# Patient Record
Sex: Female | Born: 1975 | Race: White | Hispanic: Yes | Marital: Single | Smoking: Never smoker
Health system: Southern US, Community
[De-identification: ages and names within clinical notes are randomized; demographics above are authoritative.]

## PROBLEM LIST (undated history)

## (undated) DIAGNOSIS — Q5210 Doubling of vagina, unspecified: Secondary | ICD-10-CM

## (undated) DIAGNOSIS — F329 Major depressive disorder, single episode, unspecified: Secondary | ICD-10-CM

## (undated) DIAGNOSIS — F32A Depression, unspecified: Secondary | ICD-10-CM

## (undated) DIAGNOSIS — F419 Anxiety disorder, unspecified: Secondary | ICD-10-CM

## (undated) HISTORY — PX: APPENDECTOMY: SHX54

## (undated) HISTORY — PX: ECTOPIC PREGNANCY SURGERY: SHX613

## (undated) HISTORY — DX: Major depressive disorder, single episode, unspecified: F32.9

## (undated) HISTORY — DX: Doubling of vagina, unspecified: Q52.10

## (undated) HISTORY — DX: Depression, unspecified: F32.A

---

## 1998-04-10 ENCOUNTER — Other Ambulatory Visit: Admission: RE | Admit: 1998-04-10 | Discharge: 1998-04-10 | Payer: Self-pay | Admitting: Gynecology

## 1998-07-06 ENCOUNTER — Emergency Department (HOSPITAL_COMMUNITY): Admission: EM | Admit: 1998-07-06 | Discharge: 1998-07-06 | Payer: Self-pay | Admitting: Emergency Medicine

## 1998-07-18 ENCOUNTER — Ambulatory Visit (HOSPITAL_COMMUNITY): Admission: RE | Admit: 1998-07-18 | Discharge: 1998-07-18 | Payer: Self-pay | Admitting: Orthopedic Surgery

## 1998-07-25 ENCOUNTER — Encounter: Admission: RE | Admit: 1998-07-25 | Discharge: 1998-10-23 | Payer: Self-pay | Admitting: Gynecology

## 1998-09-01 ENCOUNTER — Inpatient Hospital Stay (HOSPITAL_COMMUNITY): Admission: AD | Admit: 1998-09-01 | Discharge: 1998-09-01 | Payer: Self-pay | Admitting: Obstetrics and Gynecology

## 1998-09-20 ENCOUNTER — Inpatient Hospital Stay (HOSPITAL_COMMUNITY): Admission: AD | Admit: 1998-09-20 | Discharge: 1998-09-26 | Payer: Self-pay | Admitting: Obstetrics and Gynecology

## 1998-10-26 ENCOUNTER — Other Ambulatory Visit: Admission: RE | Admit: 1998-10-26 | Discharge: 1998-10-26 | Payer: Self-pay | Admitting: Gynecology

## 1999-05-30 ENCOUNTER — Encounter: Payer: Self-pay | Admitting: Emergency Medicine

## 1999-05-31 ENCOUNTER — Inpatient Hospital Stay (HOSPITAL_COMMUNITY): Admission: EM | Admit: 1999-05-31 | Discharge: 1999-06-02 | Payer: Self-pay | Admitting: Emergency Medicine

## 1999-05-31 ENCOUNTER — Encounter: Payer: Self-pay | Admitting: Emergency Medicine

## 2000-05-07 ENCOUNTER — Emergency Department (HOSPITAL_COMMUNITY): Admission: EM | Admit: 2000-05-07 | Discharge: 2000-05-07 | Payer: Self-pay | Admitting: Emergency Medicine

## 2000-05-07 ENCOUNTER — Encounter: Payer: Self-pay | Admitting: Emergency Medicine

## 2001-04-08 ENCOUNTER — Ambulatory Visit (HOSPITAL_COMMUNITY): Admission: RE | Admit: 2001-04-08 | Discharge: 2001-04-08 | Payer: Self-pay | Admitting: *Deleted

## 2001-05-31 ENCOUNTER — Encounter (HOSPITAL_COMMUNITY): Admission: RE | Admit: 2001-05-31 | Discharge: 2001-06-04 | Payer: Self-pay | Admitting: Obstetrics & Gynecology

## 2001-06-03 ENCOUNTER — Inpatient Hospital Stay (HOSPITAL_COMMUNITY): Admission: AD | Admit: 2001-06-03 | Discharge: 2001-06-06 | Payer: Self-pay | Admitting: Obstetrics & Gynecology

## 2001-06-08 ENCOUNTER — Inpatient Hospital Stay (HOSPITAL_COMMUNITY): Admission: AD | Admit: 2001-06-08 | Discharge: 2001-06-08 | Payer: Self-pay | Admitting: *Deleted

## 2007-02-24 ENCOUNTER — Ambulatory Visit: Payer: Self-pay | Admitting: Gynecology

## 2007-02-25 ENCOUNTER — Ambulatory Visit (HOSPITAL_COMMUNITY): Admission: RE | Admit: 2007-02-25 | Discharge: 2007-02-25 | Payer: Self-pay | Admitting: Family Medicine

## 2007-03-10 ENCOUNTER — Ambulatory Visit: Payer: Self-pay | Admitting: *Deleted

## 2009-02-02 ENCOUNTER — Ambulatory Visit: Payer: Self-pay | Admitting: Internal Medicine

## 2009-11-17 HISTORY — PX: TUBAL LIGATION: SHX77

## 2010-08-03 ENCOUNTER — Ambulatory Visit (HOSPITAL_COMMUNITY)
Admission: AD | Admit: 2010-08-03 | Discharge: 2010-08-03 | Payer: Self-pay | Source: Home / Self Care | Admitting: Obstetrics & Gynecology

## 2011-01-30 LAB — CBC
HCT: 37.4 % (ref 36.0–46.0)
MCH: 30.7 pg (ref 26.0–34.0)
MCHC: 34.5 g/dL (ref 30.0–36.0)
RDW: 13.9 % (ref 11.5–15.5)

## 2011-01-30 LAB — GC/CHLAMYDIA PROBE AMP, GENITAL

## 2011-01-30 LAB — ABO/RH: ABO/RH(D): A POS

## 2011-01-30 LAB — WET PREP, GENITAL

## 2011-04-04 NOTE — Op Note (Signed)
[facility]  Patient:    Stacy Barr, Stacy Barr                         MRN: [id number] Proc. Date: 06/03/01 Adm. Date:  [id number] Disc. Date: [id number] Attending:  Antionette Char                           Operative Report  PREOPERATIVE DIAGNOSES:       Intrauterine pregnancy at 39+ weeks.  Breech presentation.  History of intrauterine septum.  History of previous cesarean delivery.  POSTOPERATIVE DIAGNOSES:      Intrauterine pregnancy at 39+ weeks.  Breech presentation.  History of intrauterine septum.  History of previous cesarean delivery.  PROCEDURE:                    Repeat low uterine ______ cesarean delivery via Pfannenstiel.  SURGEONS:                     Roseanna Rainbow, M.D.                               Charles A. Clearance Coots, M.D.  ANESTHESIA:                   Spinal.  COMPLICATIONS:                None.  ESTIMATED BLOOD LOSS:         800 cc.  FLUIDS:                       As per Anesthesiologist.  URINE OUTPUT:                 As per anesthesiologist.  INDICATIONS:                  As above.  INTRAOPERATIVE FINDINGS:       There was an intrauterine septum/bridge bisecting the uterine cavity, extending from the fundus down to cervix.  The female infant was in frank breech presentation.  Normal tubes and ovaries.  DESCRIPTION OF PROCEDURE:     The patient was taken to the operating room with an IV running.  She was placed in the dorsal supine position with a lateral tilt, and then prepped and draped in the usual sterile fashion.   A Pfannenstiel skin incision was then made with the scalpel through the previous scar.  This was extended down to the fascia with the Bovie.  The fascia was incised in the midline and this incision extended bilaterally with Coban scissors.  The superior aspect of the fascial incision was then grasped with Kocher clamps, elevated and the underlying rectus muscle dissected off. The inferior aspect of  the fascial incision was then manipulated in a similar fashion.  The rectus muscles were separated in the midline.  Parietoperitoneum was pinched up and entered sharply with Metzenbaum scissors.  This incision was then extended superiorly and inferiorly, with good visualization of the bladder.  The bladder blade was then inserted and the vesicouterine peritoneum incised.  This incision was then extended bilaterally and the bladder flap created sharply.  The bladder blade was then replaced and the lower uterine segment was then incised in transverse fashion with the scalpel.  This incision was then extended bilaterally with the bandage scissors.  A complete breech extraction was then performed, and the infants head was delivered atraumatically and suctioned with the bulb suction.  The cord was clamped and cut.  The infant was taken to the awaiting neonatologist.  Cord The placenta was then delivered.  The uterus was then evacuated of amniotic fluid, clots and debris with a moistened laparotomy sponge.  The uterine incision was then repaired in a running interlocking fashion with 0 Monocryl.  Excellent hemostasis was noted.  The uterus was returned to the abdomen, and the pericolic gutters were copiously irrigated with warm normal saline.  The fascia was reapproximated with 0 PDS.  The skin was closed with staples.  At the close of the procedure the instruments and pack counts were said to be correct x 2.  Cefazolin 1 g was given at cord clamp.  The patient was taken to the PACU awake and in stable condition. DD:  06/03/01 TD:  06/04/01 Job: [id number]

## 2011-04-04 NOTE — Group Therapy Note (Signed)
NAME:  Stacy Barr, BOVEY NO.:  [id number]   MEDICAL RECORD NO.:  [id number]          PATIENT TYPE:  WOC   LOCATION:  [facility]                   FACILITY:  [facility]   PHYSICIAN:  Ginger Carne, MD DATE OF BIRTH:  1976/07/05   DATE OF SERVICE:                                  CLINIC NOTE   This patient is a 35 year old Hispanic female referred from the [facility] because of concern regarding an intrauterine septum and/or  vaginal septum.  Apparently the notes indicate that an OB ultrasound  demonstrated a septum of the uterus.  There is no evidence of  ultrasonography performed at [facility], there are  no records indicating the same from those records obtained from the  [facility] [place] [facility] health department.  She is a gravida 3, para 2-0-1-2.   SALIENT PHYSICAL FINDINGS:  EXTERNAL GENITALIA:  Vulva and vagina are  normal.  No evidence of septum noted.  The uterus is small, anteverted  and flexed.  Both adnexa palpable, found to be normal.   IMPRESSION AND PLAN:  The patient will have a pelvic sonogram to see if  a septum is present.  The patient understands that if one is there it  would be preferable not to place an intrauterine device to avoid the  risk of expulsion.  The patient will return in 2 weeks for ultrasound  report.           ______________________________  Ginger Carne, MD     SHB/MEDQ  D:  02/24/2007  T:  02/24/2007  Job:  [id number]

## 2011-04-04 NOTE — Discharge Summary (Signed)
[facility]  Patient:    Stacy Barr, Stacy Barr Visit Number: [id number] MRN: [id number]          Service Type: MED Location: [facility] Attending Physician:  Michaelle Copas Dictated by:   Salvadore Dom Admit Date:  06/08/2001 Discharge Date: 06/08/2001   CC:         Roseanna Rainbow, M.D.   Discharge Summary  REASON FOR ADMISSION:         Elective repeat cesarean section.  ATTENDING PHYSICIAN:          Roseanna Rainbow, M.D.  HISTORY OF PRESENT ILLNESS:   This is a 35 year old G3, P1-0-1-1, who presented at 39+ weeks for elective repeat cesarean section secondary to breech presentation and a history of a previous cesarean section for the same due to intrauterine septum.  PAST OBSTETRICAL HISTORY:     One prior cesarean section for delivery of a viable female infant at 8 pounds 5 ounces.  GYNECOLOGICAL HISTORY:        One prior spontaneous abortion at approximately 12 weeks with a D&C.  MEDICATIONS:                  Prenatal vitamins.  ALLERGIES:                    No known drug allergies.  HOSPITAL COURSE:              The patient was delivered by cesarean section of a female infant in frank breech presentation. She tolerated the procedure well. Her postpartum course was uneventful with routine postoperative care. The patient breast fed and requested IUD for contraception. At discharge the patient was in good condition.  DISPOSITION:                  She was discharged home on p.o. Motrin for pain. She was given routine postoperative and postpartum instructions per booklet. Education was done with the translator. She was placed on ad lib activity at home with pelvic rest.  DISCHARGE FOLLOWUP:           The patient is to follow up at [facility] in six weeks for routine postpartum care. Dictated by:   Salvadore Dom Attending Physician:  Michaelle Copas DD:  07/22/01 TD:  07/22/01 Job: [id number]

## 2011-04-04 NOTE — Consult Note (Signed)
[facility]  Patient:    Stacy Barr, Stacy Barr                         MRN: [id number] Adm. Date:  [id number] Attending:  Antionette Char CC:         [facility]  OB Teaching Service Office at [facility]   Consultation Report  OBSTETRICAL CONSULTATION  REASON FOR CONSULTATION:       The patient is a 35 year old, para 1, now at 38+ weeks with late onset of prenatal care and dating by a 31-week ultrasound, who presents for consultation regarding delivery plan.  PAST OBSTETRICAL HISTORY:     Remarkable for a cesarean delivery in 1999 for arrest of descent. That pregnancy was complicated by gestational diabetes, diet controlled, and she was delivered of an 8-pound 5-ounce infant. During the pregnancy on exam, it was felt that she might have had a didelphic uterus; however, at the time of delivery, the patient progressed to fully dilated, with an epidural. She pushed for an hour and a half without descent of the head. The position of the head was not documented. However, on an exam, she was felt to have a vaginal septum. At C-section, the operative findings were described as a uterine septum and not a didelphic uterus with the septum extending through the cervix into the vagina.  The present pregnancy to date has been uncomplicated. She does not have any glucose intolerance, and, by the patients report, she feels that this fetus is smaller. On physical exam, on speculum exam there may be some redundant tissue in the upper vagina around the cervix; however, on digital exam, no distinct septum was palpated.  The patient would like a trial of labor, and she did have a low transverse cesarean delivery in 1999. The patient was counseled regarding the increased risk of possible repeat cesarean delivery or possible resection of the septum in labor if it is very thin, causing dystocia.  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  PLAN:                         If the patient comes in spontaneously laboring, would not augment for any arrest disorders, and would consider an epidural and a more thorough exam, and possible intrapartum resection of the septum. If the patient would need to be induced for any indication, would consider a possible Foley bulb, with Pitocin only to initiate the labor process. DD:  05/27/01 TD:  05/27/01 Job: [id number]

## 2012-11-15 ENCOUNTER — Ambulatory Visit (INDEPENDENT_AMBULATORY_CARE_PROVIDER_SITE_OTHER): Payer: Self-pay | Admitting: Family Medicine

## 2012-11-15 VITALS — BP 110/60 | HR 69 | Temp 98.3°F | Ht 59.0 in | Wt 129.0 lb

## 2012-11-15 DIAGNOSIS — F32A Depression, unspecified: Secondary | ICD-10-CM

## 2012-11-15 DIAGNOSIS — F329 Major depressive disorder, single episode, unspecified: Secondary | ICD-10-CM

## 2012-11-15 DIAGNOSIS — N946 Dysmenorrhea, unspecified: Secondary | ICD-10-CM

## 2012-11-15 DIAGNOSIS — B354 Tinea corporis: Secondary | ICD-10-CM

## 2012-11-15 MED ORDER — FLUOXETINE HCL 20 MG PO TABS: 20.0000 mg | ORAL_TABLET | Freq: Every day | ORAL | Status: DC

## 2012-11-15 NOTE — Patient Instructions (Addendum)
For the depression, I am starting you on a medication called prozac to take every day. I would like to see you back in 3 weeks.   For the rash, you can get an over the counter cream that is called clotrimazole.   For the pain with the periods, you can take ibuprofen 2 tablets every 6 hours one day before your period.

## 2012-11-15 NOTE — Progress Notes (Signed)
Patient ID: Stacy Barr, female   DOB: Oct 05, 1976, 36 y.o.   MRN: [id number]  No chief complaint on file.   HPI Stacy Barr is a 36 y.o. female who presents as a new patient. [facility].  History was obtained through a live Spanish interpreter HPI Concerns include:  - depression: x 10 years. Occasions where she starts crying for no reason, occurs every 3 weeks, associated with bone pain every 4 months. During these episodes, she feels withdrawn, sleeps during the day, gets irritable. She has trouble falling asleep at night and feels tired during the day. [facility] after a trial of improved diet and exercise, but it closed before she could be started on it. She has never been on medications for depression before. She has never had counseling for it either. She denies any suicidal or homicidal ideation. She denies any auditory or visual hallucinations.  MDQ scores 2 points, positive for self confidence and more talkative. Otherwise, denies excessive spending, sexual behavior, denies periods of not sleeping at night time. No substance abuse.  - dysmenorrhea: cramps in first 2 days of menstrual cycle. Takes over the counter medicine for painful periods which has helped. LMP Dec 7th. Periods are every month but not regular as in they occur with 1 week variation of the month. Heavy in first 2 days, using 4-5 pads per day.  - rash in the inguinal area and along the pubic hair line. Itches. Present for 1 month. Burning associated as well. Not present intravaginal or around labia. No fever. She reports she shaves that area and used a razor that was a few days old.   Past Medical History  Diagnosis Date  . Depression     x 10 years, never on medication  . Septate vagina     Past Surgical History  Procedure Date  . Ectopic pregnancy surgery   . Tubal ligation 2011    left  salpingectomy and right ligation s/p left ectopic    Family History  Problem Relation Age of Onset  . Diabetes Mother   . Hypertension Mother   . Depression Sister     Social History History  Substance Use Topics  . Smoking status: Never Smoker   . Smokeless tobacco: Not on file  . Alcohol Use: No    Allergies not on file  Current Outpatient Prescriptions  Medication Sig Dispense Refill  . FLUoxetine (PROZAC) 20 MG tablet Take 1 tablet (20 mg total) by mouth daily.  30 tablet  3    Review of Systems Review of Systems Negative except per HPI Blood pressure 110/60, pulse 69, temperature 98.3 F (36.8 C), temperature source Oral, height 4\' 11"  (1.499 m), weight 129 lb (58.514 kg).  Physical Exam Physical Exam Physical Examination: General appearance - alert, well appearing, and in no distress Nose - normal and patent, no erythema, discharge or polyps Mouth - mucous membranes moist, pharynx normal without lesions Neck - supple, no significant adenopathy Chest - clear to auscultation, no wheezes, rales or rhonchi, symmetric air entry Heart - normal rate, regular rhythm, normal S1, S2, no murmurs, rubs, clicks or gallops Abdomen - soft, nontender, nondistended, no masses or organomegaly Extremities - peripheral pulses normal, no pedal edema, no clubbing or cyanosis Skin - irregular, ovoid erythematous papules with light scaly border, present on left and right inguinal area along hair line as well  as on mons pubis.   Data Reviewed No labs available  Assessment    See problem list    Plan    See problem list       Jillyan Plitt 11/17/2012, 10:15 AM

## 2012-11-16 DIAGNOSIS — F329 Major depressive disorder, single episode, unspecified: Secondary | ICD-10-CM | POA: Insufficient documentation

## 2012-11-16 DIAGNOSIS — B354 Tinea corporis: Secondary | ICD-10-CM | POA: Insufficient documentation

## 2012-11-16 DIAGNOSIS — F32A Depression, unspecified: Secondary | ICD-10-CM | POA: Insufficient documentation

## 2012-11-16 NOTE — Assessment & Plan Note (Signed)
? ?  Over the counter clotrimazole ?

## 2012-11-16 NOTE — Assessment & Plan Note (Signed)
Long standing depression, never previously treated with medication. Patient interested in treatment. MDQ screen score of 2 making a component of bipolar depression unlikely. Will treat unipolar depression with prozac 20mg  daily and follow up in 3 weeks.

## 2012-11-17 ENCOUNTER — Encounter: Payer: Self-pay | Admitting: Family Medicine

## 2012-11-17 DIAGNOSIS — N946 Dysmenorrhea, unspecified: Secondary | ICD-10-CM | POA: Insufficient documentation

## 2012-11-17 NOTE — Assessment & Plan Note (Addendum)
Ibuprofen 400mg  every 6hours prior to period onset and in fort 2 days of menstrual cycle. Patient to return for PAP smear and well woman exam at next visit in 3 weeks.

## 2012-12-08 ENCOUNTER — Ambulatory Visit: Payer: Self-pay | Admitting: Family Medicine

## 2012-12-20 ENCOUNTER — Ambulatory Visit (INDEPENDENT_AMBULATORY_CARE_PROVIDER_SITE_OTHER): Payer: Self-pay | Admitting: Family Medicine

## 2012-12-20 ENCOUNTER — Encounter: Payer: Self-pay | Admitting: Family Medicine

## 2012-12-20 VITALS — BP 105/73 | HR 110 | Temp 102.0°F | Ht 59.0 in | Wt 125.3 lb

## 2012-12-20 DIAGNOSIS — B179 Acute viral hepatitis, unspecified: Secondary | ICD-10-CM | POA: Insufficient documentation

## 2012-12-20 DIAGNOSIS — R17 Unspecified jaundice: Secondary | ICD-10-CM

## 2012-12-20 DIAGNOSIS — K7200 Acute and subacute hepatic failure without coma: Secondary | ICD-10-CM

## 2012-12-20 LAB — COMPREHENSIVE METABOLIC PANEL
ALT: 162 U/L — ABNORMAL HIGH (ref 0–35)
AST: 138 U/L — ABNORMAL HIGH (ref 0–37)
Alkaline Phosphatase: 179 U/L — ABNORMAL HIGH (ref 39–117)
BUN: 14 mg/dL (ref 6–23)
Chloride: 103 mEq/L (ref 96–112)
Creat: 0.88 mg/dL (ref 0.50–1.10)
Total Bilirubin: 5.6 mg/dL — ABNORMAL HIGH (ref 0.3–1.2)

## 2012-12-20 LAB — CBC
MCH: 29.2 pg (ref 26.0–34.0)
MCHC: 35.2 g/dL (ref 30.0–36.0)
MCV: 82.8 fL (ref 78.0–100.0)
Platelets: 161 10*3/uL (ref 150–400)
RDW: 14 % (ref 11.5–15.5)
WBC: 1.8 10*3/uL — ABNORMAL LOW (ref 4.0–10.5)

## 2012-12-20 NOTE — Progress Notes (Signed)
  Subjective:    Patient ID: Stacy Barr, female    DOB: Jul 01, 1976, 37 y.o.   MRN: [id number]  HPI 3--4 days of sudden onset of headache, myalgias, nausea, dizziness, confused. Worst the first 2 days, but still intermittently bad. Taking aspirin, which helps, but makes her stomach burn. Last vomited 2 days ago. Urine was dark this morning, but her stools haven't become lighter. No sore throat or cough. No constipation or diarrhea.  No other ill contacts. Lives only with her partner of 3 years and 2 daughters, ages 22 and 69.  No family history of hepatitis. Denies iv drug history or ever having hepatitis herself. Ate at [facility] recently and at a [employer] a couple weeks ago.   Her period started 4 days ago (which is usually very uncomfortable with cramps) with abdominal pain which has now resolved.   Never got her antidepressant medicine or followed up due to lack of money.  Review of Systems     Objective:   Physical Exam  Constitutional: She appears well-developed and well-nourished.  HENT:  Mouth/Throat: No oropharyngeal exudate.  Cardiovascular: Normal rate and regular rhythm.   Pulmonary/Chest: Effort normal and breath sounds normal.  Abdominal: Soft. She exhibits no distension. There is no rebound.       Mild LEFT UPPER QUADRANT tenderness. No hepatomegaly.   Lymphadenopathy:    She has no cervical adenopathy.  Neurological: She is alert.  Skin:       Icteric including her right conjunctiva.           Assessment & Plan:

## 2012-12-20 NOTE — Assessment & Plan Note (Signed)
The most severe systemic symptoms appear to be improved and she doesn't look dehydrated.   No RUQ pain or prior episodes to suggest CBD obstruction.

## 2012-12-20 NOTE — Progress Notes (Signed)
Interpreter Wyvonnia Dusky for Dr Tye Savoy in [facility]

## 2012-12-20 NOTE — Patient Instructions (Signed)
We will call her to discuss her lab results.   She can use Ibuprofen in modest doses for her myagias.   To call if recurrent vomiting.

## 2012-12-21 LAB — HEPATITIS PANEL, ACUTE
Hep B C IgM: NEGATIVE
Hepatitis B Surface Ag: NEGATIVE

## 2012-12-22 ENCOUNTER — Telehealth: Payer: Self-pay | Admitting: Family Medicine

## 2012-12-22 NOTE — Telephone Encounter (Signed)
Pt will like to know labs results, pt will like doctor call her.  [place]

## 2012-12-22 NOTE — Telephone Encounter (Signed)
Forward to PCP to review abnormal labs

## 2012-12-23 ENCOUNTER — Ambulatory Visit (INDEPENDENT_AMBULATORY_CARE_PROVIDER_SITE_OTHER): Payer: Self-pay | Admitting: Family Medicine

## 2012-12-23 ENCOUNTER — Encounter: Payer: Self-pay | Admitting: Family Medicine

## 2012-12-23 ENCOUNTER — Telehealth: Payer: Self-pay | Admitting: Family Medicine

## 2012-12-23 VITALS — BP 120/69 | HR 100 | Temp 98.7°F | Ht 59.0 in | Wt 125.0 lb

## 2012-12-23 DIAGNOSIS — K7200 Acute and subacute hepatic failure without coma: Secondary | ICD-10-CM

## 2012-12-23 DIAGNOSIS — F329 Major depressive disorder, single episode, unspecified: Secondary | ICD-10-CM

## 2012-12-23 DIAGNOSIS — B179 Acute viral hepatitis, unspecified: Secondary | ICD-10-CM

## 2012-12-23 LAB — COMPREHENSIVE METABOLIC PANEL
AST: 237 U/L — ABNORMAL HIGH (ref 0–37)
Albumin: 4.2 g/dL (ref 3.5–5.2)
Alkaline Phosphatase: 180 U/L — ABNORMAL HIGH (ref 39–117)
BUN: 12 mg/dL (ref 6–23)
Calcium: 9.7 mg/dL (ref 8.4–10.5)
Chloride: 100 mEq/L (ref 96–112)
Creat: 0.77 mg/dL (ref 0.50–1.10)
Glucose, Bld: 118 mg/dL — ABNORMAL HIGH (ref 70–99)
Potassium: 3.5 mEq/L (ref 3.5–5.3)

## 2012-12-23 LAB — CBC WITH DIFFERENTIAL/PLATELET
Basophils Absolute: 0 10*3/uL (ref 0.0–0.1)
Basophils Relative: 1 % (ref 0–1)
Eosinophils Relative: 1 % (ref 0–5)
HCT: 37.8 % (ref 36.0–46.0)
Hemoglobin: 13.2 g/dL (ref 12.0–15.0)
MCHC: 34.9 g/dL (ref 30.0–36.0)
MCV: 83.4 fL (ref 78.0–100.0)
Monocytes Absolute: 0.5 10*3/uL (ref 0.1–1.0)
Monocytes Relative: 14 % — ABNORMAL HIGH (ref 3–12)
RDW: 14.1 % (ref 11.5–15.5)

## 2012-12-23 LAB — BILIRUBIN,DIRECT & INDIRECT (FRACTIONATED): Indirect Bilirubin: 2.7 mg/dL — ABNORMAL HIGH (ref 0.0–0.9)

## 2012-12-23 NOTE — Telephone Encounter (Signed)
Tried calling patient with the pacific phone interpreter but phone did not answer and there was not a possibility to leave a message.   Will send a letter to the patient letting her know that the hepatitis virus testing was normal. Her liver tests were a little elevated, but if she is feeling better and not having persistent symptoms, she can follow up in clinic in 1 week and repeat lab work.

## 2012-12-23 NOTE — Patient Instructions (Addendum)
Voy a llamarle con los [place]. I will call with your labs.  Llamanos si tiene calentura mas alta que 100.6. Call us if your fever starts going above 100.6

## 2012-12-23 NOTE — Telephone Encounter (Signed)
I called Stacy Barr to review her lab studies which were negative for hepatitis and show elevated bilirubin, alk phos and transaminases. WBC was 1.9 K. She reports fever, intermittent RUQ pain, nausea, poor appetite, dark urine and stool with only one BM in past several days.   I asked her to call me back if she can get a ride to our office, otherwise she will need to go to the ER.

## 2012-12-24 ENCOUNTER — Telehealth: Payer: Self-pay | Admitting: Family Medicine

## 2012-12-24 NOTE — Telephone Encounter (Signed)
I called to give her the lab results. She says she is feeling better with less pains, but she remains nauseated. I told her that the labs don't suggest gall stone obstruction or infection. I asked her to come back for follow up in [facility], but sooner if she worsens or develops new symptoms. She is agreeable to that plan.

## 2012-12-24 NOTE — Assessment & Plan Note (Signed)
Not having severe symptoms now so will continue holding Fluoxetine.

## 2012-12-24 NOTE — Progress Notes (Signed)
  Subjective:    Patient ID: Minerva Areola, female    DOB: 11-Apr-1976, 37 y.o.   MRN: [id number]  HPI She has less myalgias, but still has subjective fevers. She intermittently has abdominal pain, but on the left side rather than the right side (she had said the reverse on the phone. Nauseated and poor appetite. Drinking cold liquids causes headaches. Chilly, but no shaking chills. 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    Review of Systems  Constitutional: Positive for fever, appetite change and fatigue. Negative for chills and unexpected weight change.  Respiratory: Negative for cough and shortness of breath.   Cardiovascular: Negative for chest pain.  Gastrointestinal: Positive for nausea and constipation. Negative for vomiting and diarrhea.  Genitourinary: Negative for dysuria and difficulty urinating.  Neurological: Positive for light-headedness.       Objective:   Physical Exam  Constitutional: She appears well-developed and well-nourished.       Mildly ill appearing  Cardiovascular: Normal rate and regular rhythm.   No murmur heard. Pulmonary/Chest: Effort normal and breath sounds normal.  Abdominal: Soft. She exhibits no distension and no mass. There is no tenderness. There is no guarding.       No hepatomegaly or RUQ tenderness, even on deep inspiration.   Neurological: She is alert.  Skin:       Icteric, particularly face, conjunctivae, and trunk          Assessment & Plan:

## 2012-12-24 NOTE — Assessment & Plan Note (Signed)
No current signs of extrahepatic obstruction or hemolysis. Will check CBC due to the low WBC and CMET to trend transaminases, and will fractionate bilirubin  She hasn't  lost weight  Consider Leptospirosis or other uncommon illness if other symptoms develop.

## 2012-12-31 ENCOUNTER — Ambulatory Visit: Payer: Self-pay | Admitting: Family Medicine

## 2013-01-03 ENCOUNTER — Ambulatory Visit (INDEPENDENT_AMBULATORY_CARE_PROVIDER_SITE_OTHER): Payer: Self-pay | Admitting: Family Medicine

## 2013-01-03 ENCOUNTER — Encounter: Payer: Self-pay | Admitting: Family Medicine

## 2013-01-03 VITALS — BP 108/72 | HR 89 | Temp 98.2°F | Ht 59.0 in | Wt 129.8 lb

## 2013-01-03 DIAGNOSIS — B179 Acute viral hepatitis, unspecified: Secondary | ICD-10-CM

## 2013-01-03 DIAGNOSIS — K7200 Acute and subacute hepatic failure without coma: Secondary | ICD-10-CM

## 2013-01-03 DIAGNOSIS — F329 Major depressive disorder, single episode, unspecified: Secondary | ICD-10-CM

## 2013-01-03 DIAGNOSIS — N946 Dysmenorrhea, unspecified: Secondary | ICD-10-CM

## 2013-01-03 MED ORDER — IBUPROFEN 200 MG PO TABS: 600.0000 mg | ORAL_TABLET | Freq: Four times a day (QID) | ORAL | Status: DC | PRN

## 2013-01-03 NOTE — Assessment & Plan Note (Signed)
Educated her to use Ibuprofen early with menstrual symptoms

## 2013-01-03 NOTE — Assessment & Plan Note (Signed)
Improving and appears less jaundiced. Will do labs later, hopefully she will have coverage by then.

## 2013-01-03 NOTE — Progress Notes (Signed)
Interpreter [place] Namihira for [facility] 

## 2013-01-03 NOTE — Progress Notes (Signed)
Subjective:     Patient ID: Stacy Barr, female   DOB: 02-12-1976, 37 y.o.   MRN: [id number]  HPI Jaundice - she feels back to normal with lighter urine and return of her appetite. The headache and myalgias are gone. No one else that she knows has gotten sick. She has been able to return to work, but only a few hours a couple days a week are available.   Depression - She denies feeling depressed and never got the Fluoxetine filled.   Menstrual cramps - These are always severe with associated headache, nausea, bloating. She take Acetaminophen or and OTC preparation. Last pap smear was a year ago.   Review of Systems     Objective:   Physical Exam  Abdominal: Soft. She exhibits distension. She exhibits no mass. There is no tenderness. There is no rebound.  Neurological: She is alert.  Skin: Skin is warm and dry.  Less icteric, but still visible, particularly on the unexposed bulbar conjunctiva.   Psychiatric: She has a normal mood and affect. Her behavior is normal. Judgment and thought content normal.       Assessment:         Plan:

## 2013-01-03 NOTE — Assessment & Plan Note (Signed)
She denies current problems

## 2013-01-03 NOTE — Patient Instructions (Signed)
It seems that you had some form of mild hepatitis that shouldn't harm your liver permanently, but when you have medical insurance or the orange card we should retest your liver tests.   Parece que ha tenido [place] clase de hepatitis leve que no debe causar [employer] al higado. Cuando tiene seguro medico o la targeta naranjada debemos repetir las pruebas del higado.    Dismenorrea (Dysmenorrhea) La dismenorrea es la sensacin de dolor durante el perodo menstrual. El dolor se debe a la tensin (contraccin) de los msculos del tero. Este trastorno puede producir dolor de cabeza, Dentist estomacal (nuseas), vmitos, o dolor en la zona lumbar. CUIDADOS EN EL HOGAR  Slo tome medicamentos como lo indique su mdico. Ibuprofena 200 mg 6 pastillas tres veces al dia con comida.  Coloque una almohadilla trmica o una botella de agua caliente en la zona lumbar o en el vientre (abdomen). No debe dormir con la almohadilla trmica.  La actividad fsica puede ayudar a Teacher, early years/pre.  [place]ee la zona lumbar o el vientre.  Deje de fumar.  Evite el alcohol y la cafena.  Pruebe con el yoga o la acupuntura. SOLICITE AYUDA DE INMEDIATO SI:   El dolor no se alivia con el medicamento.  El dolor [place], incluso con los medicamentos.  Tiene fiebre.  Siente nuseas de forma constante o no puede parar de vomitar.  El dolor sube a la parte superior del vientre.  El sangrado del perodo es ms abundante que lo normal.  Se desmaya. ASEGRESE DE QUE:  Comprende estas instrucciones.  Controlar su enfermedad.  Solicitar ayuda de inmediato si no mejora o si empeora. Document Released: 12/06/2010 Document Revised: 01/26/2012 [facility] Patient Information 2013 [place], [place].

## 2013-07-12 ENCOUNTER — Ambulatory Visit: Payer: Self-pay

## 2014-05-30 ENCOUNTER — Ambulatory Visit: Payer: Self-pay

## 2014-06-05 ENCOUNTER — Ambulatory Visit: Payer: Self-pay

## 2017-11-13 ENCOUNTER — Encounter (HOSPITAL_COMMUNITY): Payer: Self-pay | Admitting: Emergency Medicine

## 2017-11-13 ENCOUNTER — Ambulatory Visit (HOSPITAL_COMMUNITY)
Admission: EM | Admit: 2017-11-13 | Discharge: 2017-11-13 | Disposition: A | Discharge status: Home / Self Care | Payer: Self-pay

## 2017-11-13 ENCOUNTER — Other Ambulatory Visit: Payer: Self-pay

## 2017-11-13 DIAGNOSIS — R300 Dysuria: Secondary | ICD-10-CM

## 2017-11-13 DIAGNOSIS — N939 Abnormal uterine and vaginal bleeding, unspecified: Secondary | ICD-10-CM | POA: Diagnosis present

## 2017-11-13 DIAGNOSIS — N12 Tubulo-interstitial nephritis, not specified as acute or chronic: Secondary | ICD-10-CM

## 2017-11-13 DIAGNOSIS — N200 Calculus of kidney: Secondary | ICD-10-CM | POA: Diagnosis present

## 2017-11-13 DIAGNOSIS — Z3202 Encounter for pregnancy test, result negative: Secondary | ICD-10-CM

## 2017-11-13 DIAGNOSIS — N7093 Salpingitis and oophoritis, unspecified: Principal | ICD-10-CM | POA: Diagnosis present

## 2017-11-13 LAB — CBC WITH DIFFERENTIAL/PLATELET
BASOS PCT: 0 %
Basophils Absolute: 0 10*3/uL (ref 0.0–0.1)
EOS ABS: 0 10*3/uL (ref 0.0–0.7)
EOS PCT: 0 %
HEMATOCRIT: 34.5 % — AB (ref 36.0–46.0)
Hemoglobin: 11.4 g/dL — ABNORMAL LOW (ref 12.0–15.0)
Lymphocytes Relative: 13 %
Lymphs Abs: 1 10*3/uL (ref 0.7–4.0)
MCH: 28.7 pg (ref 26.0–34.0)
MCHC: 33 g/dL (ref 30.0–36.0)
MCV: 86.9 fL (ref 78.0–100.0)
MONO ABS: 1.1 10*3/uL — AB (ref 0.1–1.0)
MONOS PCT: 14 %
Neutro Abs: 5.6 10*3/uL (ref 1.7–7.7)
Neutrophils Relative %: 73 %
PLATELETS: 229 10*3/uL (ref 150–400)
RBC: 3.97 MIL/uL (ref 3.87–5.11)
RDW: 13.3 % (ref 11.5–15.5)
WBC: 7.7 10*3/uL (ref 4.0–10.5)

## 2017-11-13 LAB — POCT URINALYSIS DIP (DEVICE)
GLUCOSE, UA: NEGATIVE mg/dL
Ketones, ur: >=160 mg/dL — AB
NITRITE: POSITIVE — AB
Protein, ur: 100 mg/dL — AB
Specific Gravity, Urine: >=1.03 (ref 1.005–1.030)
UROBILINOGEN UA: 4 mg/dL — AB (ref 0.0–1.0)
pH: 5.5 (ref 5.0–8.0)

## 2017-11-13 LAB — COMPREHENSIVE METABOLIC PANEL
ALBUMIN: 3.5 g/dL (ref 3.5–5.0)
ALT: 72 U/L — ABNORMAL HIGH (ref 14–54)
ANION GAP: 13 (ref 5–15)
AST: 56 U/L — ABNORMAL HIGH (ref 15–41)
Alkaline Phosphatase: 119 U/L (ref 38–126)
BILIRUBIN TOTAL: 3.4 mg/dL — AB (ref 0.3–1.2)
BUN: 10 mg/dL (ref 6–20)
CO2: 19 mmol/L — ABNORMAL LOW (ref 22–32)
Calcium: 8.6 mg/dL — ABNORMAL LOW (ref 8.9–10.3)
Chloride: 99 mmol/L — ABNORMAL LOW (ref 101–111)
Creatinine, Ser: 0.8 mg/dL (ref 0.44–1.00)
GFR calc Af Amer: >60 mL/min (ref >60)
GFR calc non Af Amer: >60 mL/min (ref >60)
GLUCOSE: 98 mg/dL (ref 65–99)
POTASSIUM: 3.4 mmol/L — AB (ref 3.5–5.1)
SODIUM: 131 mmol/L — AB (ref 135–145)
TOTAL PROTEIN: 7.7 g/dL (ref 6.5–8.1)

## 2017-11-13 LAB — POCT PREGNANCY, URINE: Preg Test, Ur: NEGATIVE

## 2017-11-13 MED ORDER — ACETAMINOPHEN 325 MG PO TABS
ORAL_TABLET | ORAL | Status: AC
  Filled 2017-11-13: qty 2

## 2017-11-13 MED ORDER — ACETAMINOPHEN 325 MG PO TABS
650.0000 mg | ORAL_TABLET | Freq: Once | ORAL | Status: AC
  Administered 2017-11-13: 650 mg via ORAL

## 2017-11-13 NOTE — ED Triage Notes (Signed)
Pt states her aunt gave her "neomicina", neomycin, took it yesterday and today.

## 2017-11-13 NOTE — ED Provider Notes (Signed)
Patient arrived to urgent care with 2-day history of generalized body aches, urinary urgency/frequency, right back and pelvic pain.  Patient with tachycardia, tachypnea, febrile at 101.2.  She appears to be in mild distress, without diaphoresis.  Urine dipstick showed moderate bilirubin, greater than 160 ketones, large blood in urine, positive nitrites, small leukocytes. + CVA tenderness.  Given patient's vital signs with mild distress, will discharge patient to emergency department for further evaluation.   Belinda FisherYu, Latesha Chesney V, PA-C 11/13/17 1956

## 2017-11-13 NOTE — Discharge Instructions (Signed)
Please go to the emergency department for further evaluation and treatment needed.

## 2017-11-13 NOTE — ED Triage Notes (Signed)
Pt c/o pain everywhere, generalized body ache. Pt c/o urgency with urination. C/o pelvic pain. Pt also c/o R hip pain. Pt is febrile and tachycardic.

## 2017-11-13 NOTE — ED Triage Notes (Signed)
Patient seen at urgent care today diagnosed with UTI , urine specimen collected at urgent care /received Tylenol for fever , sent here for further evaluation  , pt. endorses urinary frequency and right low back pain .

## 2017-11-14 ENCOUNTER — Emergency Department (HOSPITAL_COMMUNITY): Payer: Self-pay

## 2017-11-14 ENCOUNTER — Other Ambulatory Visit: Payer: Self-pay

## 2017-11-14 ENCOUNTER — Inpatient Hospital Stay (HOSPITAL_COMMUNITY)
Admission: EM | Admit: 2017-11-14 | Discharge: 2017-11-15 | DRG: 759 | Disposition: A | Discharge status: Home / Self Care | Payer: Self-pay | Attending: Obstetrics and Gynecology | Admitting: Obstetrics and Gynecology

## 2017-11-14 ENCOUNTER — Encounter (HOSPITAL_COMMUNITY): Payer: Self-pay

## 2017-11-14 DIAGNOSIS — N939 Abnormal uterine and vaginal bleeding, unspecified: Secondary | ICD-10-CM | POA: Diagnosis present

## 2017-11-14 DIAGNOSIS — N200 Calculus of kidney: Secondary | ICD-10-CM

## 2017-11-14 DIAGNOSIS — N390 Urinary tract infection, site not specified: Secondary | ICD-10-CM

## 2017-11-14 DIAGNOSIS — N12 Tubulo-interstitial nephritis, not specified as acute or chronic: Secondary | ICD-10-CM

## 2017-11-14 DIAGNOSIS — N7093 Salpingitis and oophoritis, unspecified: Secondary | ICD-10-CM | POA: Diagnosis present

## 2017-11-14 HISTORY — DX: Anxiety disorder, unspecified: F41.9

## 2017-11-14 LAB — URINALYSIS, ROUTINE W REFLEX MICROSCOPIC
Bilirubin Urine: NEGATIVE
Glucose, UA: 50 mg/dL — AB
KETONES UR: 80 mg/dL — AB
NITRITE: NEGATIVE
PROTEIN: 100 mg/dL — AB
Specific Gravity, Urine: 1.024 (ref 1.005–1.030)
pH: 5 (ref 5.0–8.0)

## 2017-11-14 LAB — I-STAT BETA HCG BLOOD, ED (MC, WL, AP ONLY)

## 2017-11-14 LAB — WET PREP, GENITAL
CLUE CELLS WET PREP: NONE SEEN
Sperm: NONE SEEN
TRICH WET PREP: NONE SEEN
Yeast Wet Prep HPF POC: NONE SEEN

## 2017-11-14 LAB — I-STAT CG4 LACTIC ACID, ED: LACTIC ACID, VENOUS: 1.02 mmol/L (ref 0.5–1.9)

## 2017-11-14 MED ORDER — DEXTROSE 5 % IV SOLN
1.0000 g | Freq: Once | INTRAVENOUS | Status: AC
  Administered 2017-11-14: 1 g via INTRAVENOUS
  Filled 2017-11-14: qty 10

## 2017-11-14 MED ORDER — HYDROMORPHONE HCL 1 MG/ML IJ SOLN
0.5000 mg | INTRAMUSCULAR | Status: DC | PRN
  Filled 2017-11-14: qty 1

## 2017-11-14 MED ORDER — DOCUSATE SODIUM 100 MG PO CAPS: 100.0000 mg | ORAL_CAPSULE | Freq: Two times a day (BID) | ORAL | Status: DC | PRN

## 2017-11-14 MED ORDER — DOXYCYCLINE HYCLATE 100 MG IV SOLR
100.0000 mg | Freq: Once | INTRAVENOUS | Status: AC
  Administered 2017-11-14: 100 mg via INTRAVENOUS
  Filled 2017-11-14: qty 100

## 2017-11-14 MED ORDER — SODIUM CHLORIDE 0.9 % IV SOLN
INTRAVENOUS | Status: AC
  Administered 2017-11-14: 18:00:00 via INTRAVENOUS

## 2017-11-14 MED ORDER — METRONIDAZOLE 500 MG PO TABS
500.0000 mg | ORAL_TABLET | Freq: Two times a day (BID) | ORAL | Status: DC
  Administered 2017-11-14: 500 mg via ORAL
  Filled 2017-11-14: qty 1

## 2017-11-14 MED ORDER — ONDANSETRON HCL 4 MG/2ML IJ SOLN: 4.0000 mg | Freq: Four times a day (QID) | INTRAMUSCULAR | Status: DC | PRN

## 2017-11-14 MED ORDER — TAMSULOSIN HCL 0.4 MG PO CAPS
0.4000 mg | ORAL_CAPSULE | Freq: Every day | ORAL | Status: DC
  Administered 2017-11-15: 0.4 mg via ORAL
  Filled 2017-11-14 (×3): qty 1

## 2017-11-14 MED ORDER — SODIUM CHLORIDE 0.9 % IV BOLUS (SEPSIS)
1700.0000 mL | Freq: Once | INTRAVENOUS | Status: AC
  Administered 2017-11-14: 1700 mL via INTRAVENOUS

## 2017-11-14 MED ORDER — ONDANSETRON HCL 4 MG/2ML IJ SOLN
4.0000 mg | Freq: Once | INTRAMUSCULAR | Status: DC
  Filled 2017-11-14: qty 2

## 2017-11-14 MED ORDER — DOXYCYCLINE HYCLATE 100 MG PO TABS
100.0000 mg | ORAL_TABLET | Freq: Two times a day (BID) | ORAL | Status: DC
  Administered 2017-11-15: 100 mg via ORAL
  Filled 2017-11-14 (×2): qty 1

## 2017-11-14 MED ORDER — ONDANSETRON HCL 4 MG/2ML IJ SOLN: 4.0000 mg | Freq: Three times a day (TID) | INTRAMUSCULAR | Status: DC | PRN

## 2017-11-14 MED ORDER — KETOROLAC TROMETHAMINE 30 MG/ML IJ SOLN
30.0000 mg | Freq: Once | INTRAMUSCULAR | Status: AC
  Administered 2017-11-14: 30 mg via INTRAVENOUS
  Filled 2017-11-14: qty 1

## 2017-11-14 MED ORDER — ACETAMINOPHEN 325 MG PO TABS: 650.0000 mg | ORAL_TABLET | ORAL | Status: DC | PRN

## 2017-11-14 MED ORDER — OXYCODONE-ACETAMINOPHEN 5-325 MG PO TABS
1.0000 | ORAL_TABLET | ORAL | Status: AC | PRN
  Administered 2017-11-14 – 2017-11-15 (×2): 1 via ORAL
  Filled 2017-11-14 (×2): qty 1

## 2017-11-14 MED ORDER — SODIUM CHLORIDE 0.9 % IV SOLN
Freq: Once | INTRAVENOUS | Status: AC
  Administered 2017-11-14: 10:00:00 via INTRAVENOUS

## 2017-11-14 MED ORDER — ALUM & MAG HYDROXIDE-SIMETH 200-200-20 MG/5ML PO SUSP: 30.0000 mL | ORAL | Status: DC | PRN

## 2017-11-14 MED ORDER — ACETAMINOPHEN 500 MG PO TABS
1000.0000 mg | ORAL_TABLET | Freq: Once | ORAL | Status: DC
Start: 2017-11-14 — End: 2017-11-14
  Filled 2017-11-14: qty 2

## 2017-11-14 MED ORDER — MORPHINE SULFATE (PF) 4 MG/ML IV SOLN
4.0000 mg | Freq: Once | INTRAVENOUS | Status: AC
  Administered 2017-11-14: 4 mg via INTRAVENOUS
  Filled 2017-11-14: qty 1

## 2017-11-14 MED ORDER — DEXTROSE 5 % IV SOLN
2.0000 g | Freq: Three times a day (TID) | INTRAVENOUS | Status: DC
  Administered 2017-11-14 – 2017-11-15 (×3): 2 g via INTRAVENOUS
  Filled 2017-11-14 (×5): qty 2

## 2017-11-14 NOTE — ED Provider Notes (Signed)
[facility] Provider Note   CSN: [id number] Arrival date & time: 11/13/17  1848     History   Chief Complaint Chief Complaint  Patient presents with  . Urinary Tract Infection  . Back Pain    HPI Stacy Barr is a 41 y.o. female.  HPI Patient with 3 days of pain and burning with urination.  She reports frequent episodes of urinating small amounts with discomfort.  Also pain in the right flank.  Positive for nausea without vomiting.  No diarrhea.  Fever and chills.  Intermittent dizziness.  No chest pain or shortness of breath, no lower extremity swelling, no rashes.  Patient was seen at urgent care and referred to the emergency department for urinary tract infection with fever and tachycardia.  Patient did have an ectopic pregnancy in 2012.  It was emergently resected.  She does not remember which side.  She also reports a distant history of gonorrhea contracted from her husband.  She reports she is still sexually active periodically Past Medical History:  Diagnosis Date  . Depression    x 10 years, never on medication  . Septate vagina     Patient Active Problem List   Diagnosis Date Noted  . Acute hepatitis 12/20/2012  . Dysmenorrhea 11/17/2012  . Depression 11/16/2012  . Tinea corporis 11/16/2012    Past Surgical History:  Procedure Laterality Date  . CESAREAN SECTION    . ECTOPIC PREGNANCY SURGERY     left  . TUBAL LIGATION  2011   left salpingectomy and right ligation s/p left ectopic    OB History    No data available       Home Medications    Prior to Admission medications   Medication Sig Start Date End Date Taking? Authorizing Provider  acetaminophen (TYLENOL) 500 MG tablet Take 500 mg by mouth every 6 (six) hours as needed for mild pain.   Yes [provider]  ibuprofen (ADVIL,MOTRIN) 200 MG tablet Take 200 mg by mouth every 6 (six) hours as needed for moderate pain.   Yes [provider]  Multiple Vitamin (MULTIVITAMIN WITH MINERALS) TABS tablet Take 1 tablet by mouth daily.   Yes [provider]  neomycin (MYCIFRADIN) 500 MG tablet Take 500 mg by mouth once.    Yes [provider]  ibuprofen (ADVIL,MOTRIN) 200 MG tablet Take 3 tablets (600 mg total) by mouth every 6 (six) hours as needed for pain (onset of menstrual symptoms). Patient not taking: Reported on 11/14/2017 01/03/13   Zachery DauerHale, Wayne, MD    Family History Family History  Problem Relation Age of Onset  . Diabetes Mother   . Hypertension Mother   . Depression Sister     Social History Social History   Tobacco Use  . Smoking status: Never Smoker  . Smokeless tobacco: Never Used  Substance Use Topics  . Alcohol use: No  . Drug use: No     Allergies   Patient has no known allergies.   Review of Systems Review of Systems 10 Systems reviewed and are negative for acute change except as noted in the HPI.   Physical Exam Updated Vital Signs BP 113/66   Pulse (!) 101   Temp 100 F (37.8 C) (Oral)   Resp 18   SpO2 100%   Physical Exam  Constitutional: She is oriented to person, place, and time. She appears well-developed and well-nourished. No distress.  Patient is mildly uncomfortable in appearance.  She is alert and appropriate.  Status is clear.  No respiratory distress.  HENT:  Head: Normocephalic and atraumatic.  Eyes: Conjunctivae are normal.  Left eye nonfunctional, old.  Neck: Neck supple.  Cardiovascular: Normal rate and regular rhythm.  No murmur heard. Pulmonary/Chest: Effort normal and breath sounds normal. No respiratory distress.  Abdominal: Soft. There is tenderness.  Suprapubic and left lower quadrant tenderness.  Genitourinary:  Genitourinary Comments: Normal external vaginal exam.  Speculum exam moderate amount of slightly grayish tinged thin blood in the vaginal vault.  Cervix is tender.  Right adnexa tender.  Musculoskeletal: Normal range of  motion. She exhibits no edema or tenderness.  No lower extremity edema.  Condition of feet and lower legs excellent.  No rashes.  No calf tenderness.  Neurological: She is alert and oriented to person, place, and time. No cranial nerve deficit. She exhibits normal muscle tone. Coordination normal.  Skin: Skin is warm and dry.  Psychiatric: She has a normal mood and affect.  Nursing note and vitals reviewed.    ED Treatments / Results  Labs (all labs ordered are listed, but only abnormal results are displayed) Labs Reviewed  WET PREP, GENITAL - Abnormal; Notable for the following components:      Result Value   WBC, Wet Prep HPF POC MANY (*)    All other components within normal limits  CBC WITH DIFFERENTIAL/PLATELET - Abnormal; Notable for the following components:   Hemoglobin 11.4 (*)    HCT 34.5 (*)    Monocytes Absolute 1.1 (*)    All other components within normal limits  COMPREHENSIVE METABOLIC PANEL - Abnormal; Notable for the following components:   Sodium 131 (*)    Potassium 3.4 (*)    Chloride 99 (*)    CO2 19 (*)    Calcium 8.6 (*)    AST 56 (*)    ALT 72 (*)    Total Bilirubin 3.4 (*)    All other components within normal limits  URINALYSIS, ROUTINE W REFLEX MICROSCOPIC - Abnormal; Notable for the following components:   Color, Urine AMBER (*)    APPearance HAZY (*)    Glucose, UA 50 (*)    Hgb urine dipstick LARGE (*)    Ketones, ur 80 (*)    Protein, ur 100 (*)    Leukocytes, UA MODERATE (*)    Bacteria, UA MANY (*)    Squamous Epithelial / LPF 0-5 (*)    All other components within normal limits  URINE CULTURE  CULTURE, BLOOD (ROUTINE X 2)  CULTURE, BLOOD (ROUTINE X 2)  RPR  HIV ANTIBODY (ROUTINE TESTING)  I-STAT CG4 LACTIC ACID, ED  I-STAT BETA HCG BLOOD, ED ([facility], [facility], [facility] ONLY)  GC/CHLAMYDIA PROBE AMP ([place]) NOT AT [facility]    EKG  EKG Interpretation None       Radiology US Transvaginal Non-ob  Result Date: 11/14/2017 CLINICAL DATA:   Pelvic pain for 2 days with fever. Patient has a history of septated uterus and septated vagina. EXAM: TRANSABDOMINAL AND TRANSVAGINAL ULTRASOUND OF PELVIS DOPPLER ULTRASOUND OF OVARIES TECHNIQUE: Both transabdominal and transvaginal ultrasound examinations of the pelvis were performed. Transabdominal technique was performed for global imaging of the pelvis including uterus, ovaries, adnexal regions, and pelvic cul-de-sac. It was necessary to proceed with endovaginal exam following the transabdominal exam to visualize the bilateral ovaries and endometrium. Color and duplex Doppler ultrasound was utilized to evaluate blood flow to the ovaries. COMPARISON:  None. FINDINGS: Uterus Measurements: 9.6 x  3.9 x 4.3 cm. Two uterine horns are noted. No fibroids or other mass visualized. Endometrium Thickness: 0.7 cm in right uterine horn, 0.8 cm in left uterine horn. No focal abnormality visualized. Right ovary Measurements: 3.5 x 2.2 x 3.1 cm. There is a 2.2 x 1.5 x 2.1 cm cyst in the right ovary. Left ovary Measurements: 2.3 x 1.1 x 1.5 cm. Normal appearance/no adnexal mass. Pulsed Doppler evaluation of both ovaries demonstrates normal low-resistance arterial and venous waveforms. Other findings Trace fluid is identified in the cul-de-sac. There is a 4.1 x 2.1 x 3.6 cm tubular structure in the right adnexa correlating to the CT finding which may represent hydrosalpinx. IMPRESSION: 4.1 cm tubular structure in the right adnexa correlating to the CT finding, may represent hydrosalpinx. No evidence of ovarian torsion. Two uterine horns are identified without gross abnormality. Electronically Signed   By: Sherian Rein M.D.   On: 11/14/2017 11:31   US Pelvis Complete  Result Date: 11/14/2017 CLINICAL DATA:  Pelvic pain for 2 days with fever. Patient has a history of septated uterus and septated vagina. EXAM: TRANSABDOMINAL AND TRANSVAGINAL ULTRASOUND OF PELVIS DOPPLER ULTRASOUND OF OVARIES TECHNIQUE: Both transabdominal and  transvaginal ultrasound examinations of the pelvis were performed. Transabdominal technique was performed for global imaging of the pelvis including uterus, ovaries, adnexal regions, and pelvic cul-de-sac. It was necessary to proceed with endovaginal exam following the transabdominal exam to visualize the bilateral ovaries and endometrium. Color and duplex Doppler ultrasound was utilized to evaluate blood flow to the ovaries. COMPARISON:  None. FINDINGS: Uterus Measurements: 9.6 x 3.9 x 4.3 cm. Two uterine horns are noted. No fibroids or other mass visualized. Endometrium Thickness: 0.7 cm in right uterine horn, 0.8 cm in left uterine horn. No focal abnormality visualized. Right ovary Measurements: 3.5 x 2.2 x 3.1 cm. There is a 2.2 x 1.5 x 2.1 cm cyst in the right ovary. Left ovary Measurements: 2.3 x 1.1 x 1.5 cm. Normal appearance/no adnexal mass. Pulsed Doppler evaluation of both ovaries demonstrates normal low-resistance arterial and venous waveforms. Other findings Trace fluid is identified in the cul-de-sac. There is a 4.1 x 2.1 x 3.6 cm tubular structure in the right adnexa correlating to the CT finding which may represent hydrosalpinx. IMPRESSION: 4.1 cm tubular structure in the right adnexa correlating to the CT finding, may represent hydrosalpinx. No evidence of ovarian torsion. Two uterine horns are identified without gross abnormality. Electronically Signed   By: Sherian Rein M.D.   On: 11/14/2017 11:31   [place] Art/ven Flow Abd Pelv Doppler  Result Date: 11/14/2017 CLINICAL DATA:  Pelvic pain for 2 days with fever. Patient has a history of septated uterus and septated vagina. EXAM: TRANSABDOMINAL AND TRANSVAGINAL ULTRASOUND OF PELVIS DOPPLER ULTRASOUND OF OVARIES TECHNIQUE: Both transabdominal and transvaginal ultrasound examinations of the pelvis were performed. Transabdominal technique was performed for global imaging of the pelvis including uterus, ovaries, adnexal regions, and pelvic cul-de-sac.  It was necessary to proceed with endovaginal exam following the transabdominal exam to visualize the bilateral ovaries and endometrium. Color and duplex Doppler ultrasound was utilized to evaluate blood flow to the ovaries. COMPARISON:  None. FINDINGS: Uterus Measurements: 9.6 x 3.9 x 4.3 cm. Two uterine horns are noted. No fibroids or other mass visualized. Endometrium Thickness: 0.7 cm in right uterine horn, 0.8 cm in left uterine horn. No focal abnormality visualized. Right ovary Measurements: 3.5 x 2.2 x 3.1 cm. There is a 2.2 x 1.5 x 2.1 cm cyst in the right  ovary. Left ovary Measurements: 2.3 x 1.1 x 1.5 cm. Normal appearance/no adnexal mass. Pulsed Doppler evaluation of both ovaries demonstrates normal low-resistance arterial and venous waveforms. Other findings Trace fluid is identified in the cul-de-sac. There is a 4.1 x 2.1 x 3.6 cm tubular structure in the right adnexa correlating to the CT finding which may represent hydrosalpinx. IMPRESSION: 4.1 cm tubular structure in the right adnexa correlating to the CT finding, may represent hydrosalpinx. No evidence of ovarian torsion. Two uterine horns are identified without gross abnormality. Electronically Signed   By: Sherian Rein M.D.   On: 11/14/2017 11:31   Ct Renal Stone Study  Result Date: 11/14/2017 CLINICAL DATA:  Low back pain and nausea EXAM: CT ABDOMEN AND PELVIS WITHOUT CONTRAST TECHNIQUE: Multidetector CT imaging of the abdomen and pelvis was performed following the standard protocol without IV contrast. COMPARISON:  None. FINDINGS: Lower chest: Subsegmental atelectasis. Hepatobiliary: Unremarkable Pancreas: Unremarkable Spleen: Upper normal in size with the greatest 80 measurement of 13.1 cm. Adrenals/Urinary Tract: 3 mm calculus in the lower pole of the right kidney. Tiny calculus in the lower pole of the left kidney. No hydronephrosis. There is asymmetrical right-sided perinephric stranding as well as stranding along the course of the  right ureter. No definite ureteral calculus is identified. Bladder is unremarkable. Stomach/Bowel: Appendix is nonvisualized. It may have been resected based on the report from the prior study dated 05/30/1999. No evidence of small-bowel obstruction. Stomach is decompressed. Duodenal C-loop is unremarkable. Vascular/Lymphatic: No evidence of aortic aneurysm. Reproductive: Uterus and left adnexa are within normal limits. There is a 2.8 x 2.1 x 4.6 cm oblong fluid collection in the right adnexa representing either an oblong ovarian cyst or possibly hydrosalpinx. Other: No free-fluid in the pelvis. Musculoskeletal: No vertebral compression deformity. L5-S1 degenerative disc disease. IMPRESSION: Bilateral nephrolithiasis. No definite ureteral calculus is visualized. There is stranding surrounding the right kidney and right ureter. Differential diagnosis includes an inflammatory process versus a recently passed ureteral calculus. Correlation with urinalysis is recommended. Oblong right ovarian cyst versus hydrosalpinx. Electronically Signed   By: Jolaine Click M.D.   On: 11/14/2017 09:06    Procedures Procedures (including critical care time)  Medications Ordered in ED Medications  oxyCODONE-acetaminophen (PERCOCET/ROXICET) 5-325 MG per tablet 1 tablet (1 tablet Oral Given 11/14/17 0256)  acetaminophen (TYLENOL) tablet 1,000 mg (1,000 mg Oral Not Given 11/14/17 0747)  ondansetron (ZOFRAN) injection 4 mg (4 mg Intravenous Not Given 11/14/17 0747)  doxycycline (VIBRAMYCIN) 100 mg in dextrose 5 % 250 mL IVPB (100 mg Intravenous New Bag/Given 11/14/17 1317)  cefOXitin (MEFOXIN) 2 g in dextrose 5 % 50 mL IVPB (not administered)  metroNIDAZOLE (FLAGYL) tablet 500 mg (not administered)  sodium chloride 0.9 % bolus 1,700 mL (0 mLs Intravenous Stopped 11/14/17 1016)  cefTRIAXone (ROCEPHIN) 1 g in dextrose 5 % 50 mL IVPB (0 g Intravenous Stopped 11/14/17 0917)  0.9 %  sodium chloride infusion ( Intravenous New  Bag/Given 11/14/17 1016)  morphine 4 MG/ML injection 4 mg (4 mg Intravenous Given 11/14/17 1316)  ketorolac (TORADOL) 30 MG/ML injection 30 mg (30 mg Intravenous Given 11/14/17 1316)     Initial Impression / Assessment and Plan / ED Course  I have reviewed the triage vital signs and the nursing notes.  Pertinent labs & imaging results that were available during my care of the patient were reviewed by me and considered in my medical decision making (see chart for details).    Consult: Reviewed with Dr. Vergie Living of  OB/GYN.  Advises best management will be for admission for inpatient antibiotics for tubo-ovarian abscess.  Advises to initiate cefoxitin, doxycycline and Flagyl.  Final Clinical Impressions(s) / ED Diagnoses   Final diagnoses:  Tubo-ovarian abscess  Pyelonephritis   Patient presents as outlined above.  She is identified to have fever, right lower quadrant pelvic, flank and back pain.  CT is identified hydrosalpinx.  Based on all findings, plan will be to manage this as tubo-ovarian and abscess and pyelonephritis.  Patient is alert and appropriate.  Lactic within normal limits and blood pressure stable. ED Discharge Orders    None       Arby BarrettePfeiffer, Suriah Peragine, MD 11/20/17 1549

## 2017-11-14 NOTE — ED Notes (Signed)
Patient transported to CT 

## 2017-11-14 NOTE — ED Notes (Signed)
Pt threw up Flagyl, and asking for liquid form. [facility] @ bedside, stated will let receiving RN know.

## 2017-11-14 NOTE — ED Notes (Signed)
Pelvic cart @ bedside.  

## 2017-11-14 NOTE — ED Notes (Signed)
[facility] called for transport. 

## 2017-11-14 NOTE — ED Notes (Signed)
Patient c/o IV burning/hurting. IV with Mefoxin and NS stopped until new IV placed. Patient eating supper at this time.

## 2017-11-14 NOTE — ED Notes (Signed)
Pt to desk requesting pain medication   

## 2017-11-14 NOTE — ED Notes (Signed)
Patient transported to Ultrasound 

## 2017-11-14 NOTE — ED Notes (Signed)
[facility]/lunch tray order called @ 1412-per RN

## 2017-11-15 ENCOUNTER — Encounter (HOSPITAL_COMMUNITY): Payer: Self-pay | Admitting: Obstetrics and Gynecology

## 2017-11-15 DIAGNOSIS — N939 Abnormal uterine and vaginal bleeding, unspecified: Secondary | ICD-10-CM | POA: Diagnosis present

## 2017-11-15 DIAGNOSIS — N7093 Salpingitis and oophoritis, unspecified: Principal | ICD-10-CM

## 2017-11-15 DIAGNOSIS — N390 Urinary tract infection, site not specified: Secondary | ICD-10-CM

## 2017-11-15 DIAGNOSIS — N200 Calculus of kidney: Secondary | ICD-10-CM

## 2017-11-15 LAB — CBC WITH DIFFERENTIAL/PLATELET
Basophils Absolute: 0 10*3/uL (ref 0.0–0.1)
Basophils Relative: 0 %
Eosinophils Absolute: 0 10*3/uL (ref 0.0–0.7)
Eosinophils Relative: 0 %
HEMATOCRIT: 28 % — AB (ref 36.0–46.0)
HEMOGLOBIN: 9.5 g/dL — AB (ref 12.0–15.0)
LYMPHS ABS: 1.6 10*3/uL (ref 0.7–4.0)
LYMPHS PCT: 34 %
MCH: 29.4 pg (ref 26.0–34.0)
MCHC: 33.9 g/dL (ref 30.0–36.0)
MCV: 86.7 fL (ref 78.0–100.0)
MONO ABS: 0.4 10*3/uL (ref 0.1–1.0)
MONOS PCT: 8 %
NEUTROS ABS: 2.8 10*3/uL (ref 1.7–7.7)
NEUTROS PCT: 58 %
Platelets: 190 10*3/uL (ref 150–400)
RBC: 3.23 MIL/uL — ABNORMAL LOW (ref 3.87–5.11)
RDW: 13.2 % (ref 11.5–15.5)
WBC: 4.8 10*3/uL (ref 4.0–10.5)

## 2017-11-15 LAB — RPR: RPR: NONREACTIVE

## 2017-11-15 LAB — HIV ANTIBODY (ROUTINE TESTING W REFLEX): HIV SCREEN 4TH GENERATION: NONREACTIVE

## 2017-11-15 MED ORDER — ONDANSETRON HCL 4 MG/2ML IJ SOLN
4.0000 mg | Freq: Four times a day (QID) | INTRAMUSCULAR | Status: DC | PRN
  Filled 2017-11-15: qty 2

## 2017-11-15 MED ORDER — METRONIDAZOLE IN NACL 5-0.79 MG/ML-% IV SOLN
500.0000 mg | Freq: Two times a day (BID) | INTRAVENOUS | Status: DC
  Administered 2017-11-15 (×2): 500 mg via INTRAVENOUS
  Filled 2017-11-15 (×3): qty 100

## 2017-11-15 MED ORDER — AMOXICILLIN-POT CLAVULANATE 600-42.9 MG/5ML PO SUSR: 600.0000 mg | Freq: Two times a day (BID) | ORAL | 0 refills | Status: DC

## 2017-11-15 MED ORDER — OXYCODONE HCL 5 MG/5ML PO SOLN
10.0000 mg | ORAL | Status: DC | PRN
  Administered 2017-11-15: 10 mg via ORAL
  Filled 2017-11-15: qty 10

## 2017-11-15 MED ORDER — DOXYCYCLINE HYCLATE 100 MG IV SOLR
100.0000 mg | Freq: Two times a day (BID) | INTRAVENOUS | Status: DC
  Administered 2017-11-15 (×2): 100 mg via INTRAVENOUS
  Filled 2017-11-15 (×3): qty 100

## 2017-11-15 MED ORDER — TAMSULOSIN HCL 0.4 MG PO CAPS: 0.4000 mg | ORAL_CAPSULE | Freq: Every day | ORAL | 1 refills | Status: DC

## 2017-11-15 MED ORDER — ACETAMINOPHEN 160 MG/5ML PO SOLN
650.0000 mg | ORAL | Status: DC | PRN
  Administered 2017-11-15: 650 mg via ORAL
  Filled 2017-11-15: qty 20.3

## 2017-11-15 MED ORDER — OXYCODONE HCL 5 MG/5ML PO SOLN: 5.0000 mg | ORAL | Status: DC | PRN

## 2017-11-15 MED ORDER — OXYCODONE HCL 5 MG/5ML PO SOLN: 5.0000 mg | ORAL | 0 refills | Status: DC | PRN

## 2017-11-15 MED ORDER — ACETAMINOPHEN 160 MG/5ML PO SOLN: 325.0000 mg | ORAL | Status: DC | PRN

## 2017-11-15 MED ORDER — ONDANSETRON 4 MG PO TBDP
4.0000 mg | ORAL_TABLET | Freq: Four times a day (QID) | ORAL | Status: DC | PRN
  Administered 2017-11-15: 4 mg via ORAL
  Filled 2017-11-15 (×2): qty 1

## 2017-11-15 MED ORDER — DOXYCYCLINE MONOHYDRATE 25 MG/5ML PO SUSR: 100.0000 mg | Freq: Two times a day (BID) | ORAL | 0 refills | Status: DC

## 2017-11-15 NOTE — H&P (Signed)
Obstetrics & Gynecology H&P   Date of Admission: 11/15/2017   Primary OBGYN: None Primary Care Provider: System, Provider Not In  Reason for Admission: concern for right sided TOA vs right sided pyelo and fever  History of Present Illness: [name] is a 41 y.o. [id number] (Patient's last menstrual period was 11/14/2017 (exact date).), with the above CC. PMHx is significant for h/o kidney stones, h/o ectopic pregnancy surgery, h/o appendectomy, h/o BTL, c-section and transaminitis.  Pt states right pain started on 12/24-25 and has been steady since. No prior s/s and felt feverish and had some nausea and vomiting. LMP ongoing and has been irregular in the past year or so sometimes going a few months w/o one but usually lasts about a week when it does happen.     Pt seen in the er and had temp and started on rocephin, ct scan showed small b/l non obstructing stones and right adnexal process confirmed on u/s. She was placed on rocephin until gyn was called.   ROS: A 12-point review of systems was performed and negative, except as stated in the above HPI.  OBGYN History: As per HPI. OB History  Gravida Para Term Preterm AB Living  4 2 2   2 2   SAB TAB Ectopic Multiple Live Births               # Outcome Date GA Lbr Len/2nd Weight Sex Delivery Anes PTL Lv  4 Term           3 Term           2 AB           1 AB             Obstetric Comments  c-section x 2     Past Medical History: Past Medical History:  Diagnosis Date  . Anxiety   . Depression    x 10 years, never on medication  . Septate vagina     Past Surgical History: Past Surgical History:  Procedure Laterality Date  . APPENDECTOMY    . CESAREAN SECTION    . ECTOPIC PREGNANCY SURGERY     left  . TUBAL LIGATION  2011   left salpingectomy and right ligation s/p left ectopic    Family History:  Family History  Problem Relation Age of Onset  . Diabetes Mother   . Hypertension Mother   . Depression Sister    . Mental illness Sister     Social History:  Social History   Socioeconomic History  . Marital status: Single    Spouse name: Not on file  . Number of children: 2  . Years of education: 9  . Highest education level: Not on file  Social Needs  . Financial resource strain: Not on file  . Food insecurity - worry: Not on file  . Food insecurity - inability: Not on file  . Transportation needs - medical: Not on file  . Transportation needs - non-medical: Not on file  Occupational History    Employer: [employer]  Tobacco Use  . Smoking status: Never Smoker  . Smokeless tobacco: Never Used  Substance and Sexual Activity  . Alcohol use: No  . Drug use: No  . Sexual activity: Yes    Birth control/protection: Condom  Other Topics Concern  . Not on file  Social History Narrative   Lives with her partner of 3 years, and her 2 daughters [id number] [phone] [id number].  Allergy: No Known Allergies  Current Outpatient Medications: Medications Prior to Admission  Medication Sig Dispense Refill Last Dose  . acetaminophen (TYLENOL) 500 MG tablet Take 500 mg by mouth every 6 (six) hours as needed for mild pain.   11/13/2017 at Unknown time  . ibuprofen (ADVIL,MOTRIN) 200 MG tablet Take 200 mg by mouth every 6 (six) hours as needed for moderate pain.   11/13/2017 at Unknown time  . Multiple Vitamin (MULTIVITAMIN WITH MINERALS) TABS tablet Take 1 tablet by mouth daily.   Past Week at Unknown time  . neomycin (MYCIFRADIN) 500 MG tablet Take 500 mg by mouth once.    11/13/2017 at Unknown time  . ibuprofen (ADVIL,MOTRIN) 200 MG tablet Take 3 tablets (600 mg total) by mouth every 6 (six) hours as needed for pain (onset of menstrual symptoms). (Patient not taking: Reported on 11/14/2017) 30 tablet 0 Not Taking at Unknown time     Hospital Medications: Current Facility-Administered Medications  Medication Dose Route Frequency Provider Last Rate Last Dose  . 0.9 %  sodium chloride infusion    Intravenous STAT Arby Barrette, MD 75 mL/hr at 11/14/17 1737    . acetaminophen (TYLENOL) tablet 650 mg  650 mg Oral Q4H PRN Arby Barrette, MD      . alum & mag hydroxide-simeth (MAALOX/MYLANTA) 200-200-20 MG/5ML suspension 30 mL  30 mL Oral Q4H PRN Albia Bing, MD      . cefOXitin (MEFOXIN) 2 g in dextrose 5 % 50 mL IVPB  2 g Intravenous Q8H Arby Barrette, MD   Stopped at 11/14/17 2028  . docusate sodium (COLACE) capsule 100 mg  100 mg Oral BID PRN [place] Bing, MD      . doxycycline (VIBRA-TABS) tablet 100 mg  100 mg Oral Q12H [place] Bing, MD      . metroNIDAZOLE (FLAGYL) IVPB 500 mg  500 mg Intravenous Q12H [place] Bing, MD      . ondansetron (ZOFRAN-ODT) disintegrating tablet 4 mg  4 mg Oral Q6H PRN [place] Bing, MD      . oxyCODONE-acetaminophen (PERCOCET/ROXICET) 5-325 MG per tablet 1 tablet  1 tablet Oral Q30 min PRN Arby Barrette, MD   1 tablet at 11/14/17 0256  . tamsulosin (FLOMAX) capsule 0.4 mg  0.4 mg Oral Daily [place] Bing, MD         Physical Exam:   Current Vital Signs 24h Vital Sign Ranges  T 99.4 F (37.4 C) Temp  Avg: 99.4 F (37.4 C)  Min: 99.4 F (37.4 C)  Max: 99.4 F (37.4 C)  BP 115/62 BP  Min: 101/71  Max: 118/70  HR 99 Pulse  Avg: 94.8  Min: 72  Max: 104  RR 18 Resp  Avg: 17  Min: 15  Max: 18  SaO2 98 % Not Delivered SpO2  Avg: 99 %  Min: 95 %  Max: 100 %       24 Hour I/O Current Shift I/O  Time Ins Outs 12/29 0701 - 12/30 0700 In: 2956.6 [I.V.:2606.6] Out: -  12/29 1901 - 12/30 0700 In: 50  Out: -    Patient Vitals for the past 24 hrs:  BP Temp Temp src Pulse Resp SpO2 Height Weight  11/14/17 2310 115/62 99.4 F (37.4 C) Oral 99 18 98 % 5' (1.524 m) 150 lb (68 kg)  11/14/17 2217 117/73 - - 96 - 99 % - -  11/14/17 2130 112/65 - - 93 - 97 % - -  11/14/17 2100 110/74 - - 97 -  99 % - -  11/14/17 2030 105/68 - - 100 - 99 % - -  11/14/17 2000 105/65 - - (!) 101 - 99 % - -  11/14/17 1956 109/67 - - (!) 104 - 100 % - -   11/14/17 1900 114/62 - - 97 - 100 % - -  11/14/17 1830 114/70 - - 89 - 100 % - -  11/14/17 1800 101/71 - - 81 - 100 % - -  11/14/17 1400 101/60 - - (!) 101 - 95 % - -  11/14/17 1330 105/62 - - 98 - 97 % - -  11/14/17 1302 113/66 - - (!) 101 - 100 % - -  11/14/17 1000 107/76 - - 92 - 100 % - -  11/14/17 0930 112/73 - - 93 - 100 % - -  11/14/17 0849 107/73 - - 72 18 100 % - -  11/14/17 0109 118/70 - - 98 15 100 % - -    Body mass index is 29.29 kg/m. General appearance: Well nourished, well developed female in no acute distress.  Neck:  Supple, normal appearance, and no thyromegaly  Cardiovascular: S1, S2 normal, no murmur, rub or gallop, regular rate and rhythm Respiratory:  Clear to auscultation bilateral. Normal respiratory effort Abdomen: positive bowel sounds and no masses, hernias; diffusely non tender to palpation except mildly in the suprapubic and rlq area but no peritoneal s/s. Non distended.  CVAT: right sided CVAT Neuro/Psych:  Normal mood and affect.  Skin:  Warm and dry.  Extremities: no clubbing, cyanosis, or edema.    Laboratory: Negative: UPT, wet prep Pending: ucx, bcx, STI labs  Recent Labs  Lab 11/13/17 1947  WBC 7.7  HGB 11.4*  HCT 34.5*  PLT 229   Recent Labs  Lab 11/13/17 1947  NA 131*  K 3.4*  CL 99*  CO2 19*  BUN 10  CREATININE 0.80  CALCIUM 8.6*  PROT 7.7  BILITOT 3.4*  ALKPHOS 119  ALT 72*  AST 56*  GLUCOSE 98    Imaging:  CLINICAL DATA:  Pelvic pain for 2 days with fever. Patient has a history of septated uterus and septated vagina.  EXAM: TRANSABDOMINAL AND TRANSVAGINAL ULTRASOUND OF PELVIS  DOPPLER ULTRASOUND OF OVARIES  TECHNIQUE: Both transabdominal and transvaginal ultrasound examinations of the pelvis were performed. Transabdominal technique was performed for global imaging of the pelvis including uterus, ovaries, adnexal regions, and pelvic cul-de-sac.  It was necessary to proceed with endovaginal exam  following the transabdominal exam to visualize the bilateral ovaries and endometrium. Color and duplex Doppler ultrasound was utilized to evaluate blood flow to the ovaries.  COMPARISON:  None.  FINDINGS: Uterus  Measurements: 9.6 x 3.9 x 4.3 cm. Two uterine horns are noted. No fibroids or other mass visualized.  Endometrium  Thickness: 0.7 cm in right uterine horn, 0.8 cm in left uterine horn. No focal abnormality visualized.  Right ovary  Measurements: 3.5 x 2.2 x 3.1 cm. There is a 2.2 x 1.5 x 2.1 cm cyst in the right ovary.  Left ovary  Measurements: 2.3 x 1.1 x 1.5 cm. Normal appearance/no adnexal mass.  Pulsed Doppler evaluation of both ovaries demonstrates normal low-resistance arterial and venous waveforms.  Other findings  Trace fluid is identified in the cul-de-sac.  There is a 4.1 x 2.1 x 3.6 cm tubular structure in the right adnexa correlating to the CT finding which may represent hydrosalpinx.  IMPRESSION: 4.1 cm tubular structure in the right adnexa correlating to the  CT finding, may represent hydrosalpinx.  No evidence of ovarian torsion.  Two uterine horns are identified without gross abnormality.   Electronically Signed   By: Sherian Rein M.D.   On: 11/14/2017 11:31  CLINICAL DATA:  Low back pain and nausea  EXAM: CT ABDOMEN AND PELVIS WITHOUT CONTRAST  TECHNIQUE: Multidetector CT imaging of the abdomen and pelvis was performed following the standard protocol without IV contrast.  COMPARISON:  None.  FINDINGS: Lower chest: Subsegmental atelectasis.  Hepatobiliary: Unremarkable  Pancreas: Unremarkable  Spleen: Upper normal in size with the greatest 80 measurement of 13.1 cm.  Adrenals/Urinary Tract: 3 mm calculus in the lower pole of the right kidney. Tiny calculus in the lower pole of the left kidney. No hydronephrosis. There is asymmetrical right-sided perinephric stranding as well as stranding  along the course of the right ureter. No definite ureteral calculus is identified. Bladder is unremarkable.  Stomach/Bowel: Appendix is nonvisualized. It may have been resected based on the report from the prior study dated 05/30/1999. No evidence of small-bowel obstruction. Stomach is decompressed. Duodenal C-loop is unremarkable.  Vascular/Lymphatic: No evidence of aortic aneurysm.  Reproductive: Uterus and left adnexa are within normal limits. There is a 2.8 x 2.1 x 4.6 cm oblong fluid collection in the right adnexa representing either an oblong ovarian cyst or possibly hydrosalpinx.  Other: No free-fluid in the pelvis.  Musculoskeletal: No vertebral compression deformity. L5-S1 degenerative disc disease.  IMPRESSION: Bilateral nephrolithiasis.  No definite ureteral calculus is visualized.  There is stranding surrounding the right kidney and right ureter. Differential diagnosis includes an inflammatory process versus a recently passed ureteral calculus. Correlation with urinalysis is recommended.  Oblong right ovarian cyst versus hydrosalpinx.   Electronically Signed   By: Jolaine Click M.D.   On: 11/14/2017 09:06  Assessment: Ms. Esqueda is a 41 y.o. [id number] (Patient's last menstrual period was 11/14/2017 (exact date).) stable with possible right pyelo vs toa  Plan: *Admit to GYN *Continue cefoxitin/doxycycline/flagyl. Difficulty with taking pills so will do IV for now and talk to pharmacy about what can be better tolerated. Last fever was 38/4 on 12/28 at 1810. F/u am cbc *f/u labs *stones: try and see if pt can take flomax *AUB: f/u in clinic as outpatient and update pap and consider embx *FEN/GI: regular diet, sliv *PPx: scds, oob *Pain: try po meds; may have to do liquid *Dispo: when able to tolerate an outpatient regimen and continues to improve.   Total time taking care of the patient was 25 minutes, with greater than 50% of the time spent  in face to face interaction with the patient.  Cornelia Copa MD Attending Center for [facility])

## 2017-11-15 NOTE — Progress Notes (Signed)
Pt threw up doxycycline, call Dr. Vergie LivingPickens and switched antibiotic to IV.

## 2017-11-15 NOTE — Progress Notes (Signed)
Discharge instructions reviewed with patient per spanish interpreter.  Patient states understanding of home care, medications, activity, signs/symptoms to report to MD and return MD office visit.  Patients significant other and family will assist with her care @ home.  No home  equipment needed, patient has prescriptions and all personal belongings.  Patient ambulated for discharge in stable condition with staff without incident.

## 2017-11-15 NOTE — Discharge Summary (Signed)
Physician Discharge Summary  Patient ID: Stacy Barr MRN: [id number] DOB/AGE: 42/07/1976 42 y.o.  Admit date: 11/14/2017 Discharge date: 11/15/2017  Admission Diagnoses: pelvic pain; possible pyelonephritis; h/o kidney stones  Discharge Diagnoses:  Active Problems:   TOA (tubo-ovarian abscess)   Abnormal uterine bleeding (AUB)   Nephrolithiasis   Discharged Condition: good  Hospital Course: Pt was admitted for possible pyelonephritis and possible TOA.  She has a h/o kidney stones. She began to have pain on Dec 25th. She has done well today. She had 2 doses of Oxycodone since admission. She reports nausea earlier with pills. She is unable to take pills. She was able to tolerate liquid meds and breakfast. She feels ready to go home.       Significant Diagnostic Studies: labs: CBC and microbiology: urine culture: pending. UA: + leuk  Treatments: IV hydration and antibiotics: metronidazole and Doxycycline and Cefoxitin  Discharge Exam: Blood pressure 95/60, pulse 79, temperature 98.3 F (36.8 C), temperature source Oral, resp. rate 18, height 5' (1.524 m), weight 138 lb 3.2 oz (62.7 kg), last menstrual period 11/14/2017, SpO2 100 %. General appearance: alert and no distress Back: symmetric, no curvature. ROM normal. No CVA tenderness. GI: soft; slightly tender in the lower quads   CBC    Component Value Date/Time   WBC 4.8 11/15/2017 0516   RBC 3.23 (L) 11/15/2017 0516   HGB 9.5 (L) 11/15/2017 0516   HCT 28.0 (L) 11/15/2017 0516   PLT 190 11/15/2017 0516   MCV 86.7 11/15/2017 0516   MCH 29.4 11/15/2017 0516   MCHC 33.9 11/15/2017 0516   RDW 13.2 11/15/2017 0516   LYMPHSABS 1.6 11/15/2017 0516   MONOABS 0.4 11/15/2017 0516   EOSABS 0.0 11/15/2017 0516   BASOSABS 0.0 11/15/2017 0516    Disposition: 01-Home or Self Care  Discharge Instructions    Call MD for:  difficulty breathing, headache or visual disturbances   Complete by:  As directed    Call MD for:   persistant nausea and vomiting   Complete by:  As directed    Call MD for:  redness, tenderness, or signs of infection (pain, swelling, redness, odor or green/yellow discharge around incision site)   Complete by:  As directed    Call MD for:  severe uncontrolled pain   Complete by:  As directed    Call MD for:  temperature >100.4   Complete by:  As directed    Diet - low sodium heart healthy   Complete by:  As directed    Increase activity slowly   Complete by:  As directed      Allergies as of 11/15/2017   No Known Allergies     Medication List    STOP taking these medications   acetaminophen 500 MG tablet Commonly known as:  TYLENOL     TAKE these medications   amoxicillin-clavulanate 600-42.9 MG/5ML suspension Commonly known as:  AUGMENTIN ES-600 Take 5 mLs (600 mg total) by mouth 2 (two) times daily.   doxycycline 25 MG/5ML Susr Commonly known as:  VIBRAMYCIN Take 20 mLs (100 mg total) by mouth 2 (two) times daily.   ibuprofen 200 MG tablet Commonly known as:  ADVIL,MOTRIN Take 3 tablets (600 mg total) by mouth every 6 (six) hours as needed for pain (onset of menstrual symptoms). What changed:  Another medication with the same name was removed. Continue taking this medication, and follow the directions you see here.   multivitamin with minerals Tabs tablet Take 1  tablet by mouth daily.   neomycin 500 MG tablet Commonly known as:  MYCIFRADIN Take 500 mg by mouth once.   oxyCODONE 5 MG/5ML solution Commonly known as:  ROXICODONE Take 5 mLs (5 mg total) by mouth every 4 (four) hours as needed for moderate pain or severe pain.   tamsulosin 0.4 MG Caps capsule Commonly known as:  FLOMAX Take 1 capsule (0.4 mg total) by mouth daily. Start taking on:  11/16/2017        Signed: [name] 11/15/2017, 4:08 PM

## 2017-11-15 NOTE — Discharge Instructions (Signed)
Pielonefritis en los adultos °(Pyelonephritis, Adult) °La pielonefritis es una infección del riñón. Los riñones son los órganos que filtran la sangre y eliminan los residuos del torrente sanguíneo a través de la orina. La orina pasa desde los riñones, a través de los uréteres, hacia la vejiga. Hay dos tipos principales de pielonefritis: °· Infecciones que se inician rápidamente sin síntomas previos (pielonefritis aguda). °· Infecciones que persisten durante un período prolongado (pielonefritis crónica). °En la mayoría de los casos, la infección desaparece con el tratamiento y no causa otros problemas. Las infecciones más graves o crónicas a veces pueden propagarse al torrente sanguíneo u ocasionar otros problemas en los riñones. °CAUSAS °Por lo general, entre las causas de esta afección, se incluyen las siguientes: °· Bacterias que pasan desde la vejiga al riñón a través de la orina infectada. La orina de la vejiga puede infectarse por bacterias relacionadas con estas causas: °? Infección en la vejiga (cistitis). °? Inflamación de la próstata (prostatitis). °? Relaciones sexuales en las mujeres. °· Bacterias que pasan del torrente sanguíneo al riñón. °FACTORES DE RIESGO °Es más probable que esta afección se manifieste en: °· Las embarazadas. °· Las personas de edad avanzada. °· Los diabéticos. °· Las personas que tienen cálculos en los riñones o la vejiga. °· Las personas que tienen otras anomalías en el riñón o los uréteres. °· Las personas que tienen una sonda vesical. °· Las personas con cáncer. °· Las personas que son sexualmente activas. °· Las mujeres que usan espermicidas. °· Las personas que han tenido una infección previa en las vías urinarias. °SÍNTOMAS °Los síntomas de esta afección incluyen lo siguiente: °· Ganas frecuentes de orinar. °· Necesidad intensa o persistente de orinar. °· Sensación de ardor o escozor al orinar. °· Dolor abdominal. °· Dolor de espalda. °· Dolor al costado del cuerpo o en la  fosa lumbar. °· Fiebre. °· Escalofríos. °· Sangre en la orina u orina oscura. °· Náuseas. °· Vómitos. °DIAGNÓSTICO °Esta afección se puede diagnosticar en función de lo siguiente: °· Examen físico e historia clínica. °· Análisis de orina. °· Análisis de sangre. °También pueden hacerle estudios de diagnóstico por imágenes de los riñones, por ejemplo, una ecografía o una tomografía computarizada. °TRATAMIENTO °El tratamiento de esta afección puede depender de la gravedad de la infección. °· Si la infección es leve y se detecta rápidamente, pueden administrarle antibióticos por vía oral. Deberá tomar líquido para permanecer hidratado. °· Si la infección es más grave, es posible que deban hospitalizarlo para administrarle antibióticos directamente en una vena a través de una vía intravenosa (IV). Quizás también deban administrarle líquidos a través de una vía intravenosa si no se encuentra bien hidratado. Después de la hospitalización, es posible que deba tomar antibióticos durante un tiempo. °Podrán prescribirle otros tratamientos según la causa de la infección. °INSTRUCCIONES PARA EL CUIDADO EN EL HOGAR °Medicamentos °· Tome los medicamentos de venta libre y los recetados solamente como se lo haya indicado el médico. °· Si le recetaron un antibiótico, tómelo como se lo haya indicado el médico. No deje de tomar los antibióticos aunque comience a sentirse mejor. °Instrucciones generales °· Beba suficiente líquido para mantener la orina clara o de color amarillo pálido. °· Evite la cafeína, el té y las bebidas gaseosas. Estas sustancias irritan la vejiga. °· Orine con frecuencia. Evite retener la orina durante largos períodos. °· Orine antes y después de las relaciones sexuales. °· Después de defecar, las mujeres deben higienizarse la región perineal desde adelante hacia atrás. Use cada trozo de   papel higiénico solo una vez. °· Concurra a todas las visitas de control como se lo haya indicado el médico. Esto es  importante. °SOLICITE ATENCIÓN MÉDICA SI: °· Los síntomas no mejoran después de 2 días de tratamiento. °· Los síntomas empeoran. °· Tiene fiebre. °SOLICITE ATENCIÓN MÉDICA DE INMEDIATO SI: °· No puede tomar los antibióticos ni ingerir líquidos. °· Comienza a sentir escalofríos. °· Vomita. °· Siente un dolor intenso en la espalda o en la fosa lumbar. °· Se desmaya o siente una debilidad extrema. °Esta información no tiene como fin reemplazar el consejo del médico. Asegúrese de hacerle al médico cualquier pregunta que tenga. °Document Released: 08/13/2005 Document Revised: 07/25/2015 Document Reviewed: 02/26/2015 °Elsevier Interactive Patient Education © 2018 [employer]vier Inc. ° °

## 2017-11-15 NOTE — ED Notes (Signed)
This RN pulled Dilaudid out of Pyxis under pt's name. Dilaudid was never given due to pt being transferred. Dilaudid will be returned to pharmacy [facility]. Verified w/ Irving BurtonEmily, Charge RN.

## 2017-11-16 ENCOUNTER — Other Ambulatory Visit: Payer: Self-pay | Admitting: Student

## 2017-11-16 ENCOUNTER — Telehealth: Payer: Self-pay | Admitting: Student

## 2017-11-16 ENCOUNTER — Telehealth: Payer: Self-pay | Admitting: Obstetrics and Gynecology

## 2017-11-16 LAB — URINE CULTURE

## 2017-11-16 LAB — GC/CHLAMYDIA PROBE AMP (~~LOC~~) NOT AT ARMC
Chlamydia: NEGATIVE
NEISSERIA GONORRHEA: NEGATIVE

## 2017-11-16 MED ORDER — TAMSULOSIN HCL 0.4 MG PO CAPS: 0.4000 mg | ORAL_CAPSULE | Freq: Every day | ORAL | 0 refills | Status: DC

## 2017-11-16 MED ORDER — OXYCODONE HCL 5 MG/5ML PO SOLN: 5.0000 mg | ORAL | 0 refills | Status: DC | PRN

## 2017-11-16 MED ORDER — NEOMYCIN SULFATE 500 MG PO TABS: 500.0000 mg | ORAL_TABLET | Freq: Once | ORAL | 0 refills | Status: AC

## 2017-11-16 MED ORDER — AMOXICILLIN-POT CLAVULANATE 600-42.9 MG/5ML PO SUSR: 600.0000 mg | Freq: Two times a day (BID) | ORAL | 0 refills | Status: DC

## 2017-11-16 NOTE — Progress Notes (Unsigned)
Patient's daughter called requesting change in pharmacy; new RX sent to patient preference. Roxicodone RX to be picked up here on 11-17-2017.   Luna KitchensKathryn Kooistra CNM

## 2017-11-16 NOTE — Telephone Encounter (Signed)
Returned call to patient's daughter. They were concerned about cost of antibiotics patient sent home with. Reviewed regimen with pharmacist, per UTD, augmentin appropriate for TOA. Advised daughter Jerrel IvoryGabrielle (patient does not speak english) to take augmentin and not fill doxycycline. She will come for f/u visit on Friday to ensure improvement in condition. Answered all questions, they will call back with any issues.   Baldemar LenisK. Meryl Davis, M.D. Attending Obstetrician & Gynecologist, [facility] for [facility], [facility]

## 2017-11-16 NOTE — Telephone Encounter (Signed)
Patient's daughter called Coralie Common(Gabriella) and said that she needed her prescriptions sent to a different pharmacy ([facility] [address] Rx to [facility] d/c, and [facility]. Patient will continue with plan to take Augmentin, not Doxycycline (per. Dr. Earlene Plateravis note). Patient is feeling well (per Daughter) and so daughter declines to come into [facility] and pick up her Roxicodone prescription at this time. She will pick up tomorrow during the day, 11-17-2017.   Luna KitchensKathryn Marijean Montanye CNM

## 2017-11-19 LAB — CULTURE, BLOOD (ROUTINE X 2)
Culture: NO GROWTH
Culture: NO GROWTH
Special Requests: ADEQUATE
Special Requests: ADEQUATE

## 2017-11-20 ENCOUNTER — Ambulatory Visit (INDEPENDENT_AMBULATORY_CARE_PROVIDER_SITE_OTHER): Payer: Self-pay | Admitting: Obstetrics and Gynecology

## 2017-11-20 ENCOUNTER — Encounter: Payer: Self-pay | Admitting: Obstetrics and Gynecology

## 2017-11-20 VITALS — BP 107/62 | HR 68 | Wt 134.5 lb

## 2017-11-20 DIAGNOSIS — N7093 Salpingitis and oophoritis, unspecified: Secondary | ICD-10-CM

## 2017-11-20 NOTE — Progress Notes (Signed)
   GYNECOLOGY OFFICE VISIT NOTE  History:  42 y.o. [id number] here today for follow up from hospital. She was admitted for presumed TOA and sent home on PO antibiotics. She has been doing well, reports pain is completely gone. She is eating, ambulating, voiding regularly without any issues. Denies nausea/vomiting. Overall doing very well.   Past Medical History:  Diagnosis Date  . Anxiety   . Depression    x 10 years, never on medication  . Septate vagina     Past Surgical History:  Procedure Laterality Date  . APPENDECTOMY    . CESAREAN SECTION    . ECTOPIC PREGNANCY SURGERY     left  . TUBAL LIGATION  2011   left salpingectomy and right ligation s/p left ectopic     Current Outpatient Medications:  .  amoxicillin-clavulanate (AUGMENTIN) 600-42.9 MG/5ML suspension, Take 5 mLs (600 mg total) by mouth 2 (two) times daily., Disp: 200 mL, Rfl: 0 .  ibuprofen (ADVIL,MOTRIN) 200 MG tablet, Take 3 tablets (600 mg total) by mouth every 6 (six) hours as needed for pain (onset of menstrual symptoms)., Disp: 30 tablet, Rfl: 0 .  Multiple Vitamin (MULTIVITAMIN WITH MINERALS) TABS tablet, Take 1 tablet by mouth daily., Disp: , Rfl:  .  tamsulosin (FLOMAX) 0.4 MG CAPS capsule, Take 1 capsule (0.4 mg total) by mouth daily., Disp: 30 capsule, Rfl: 0  The following portions of the patient's history were reviewed and updated as appropriate: allergies, current medications, past family history, past medical history, past social history, past surgical history and problem list.    Review of Systems:  Pertinent items noted in HPI and remainder of comprehensive ROS otherwise negative.   Objective:  Physical Exam BP 107/62   Pulse 68   Wt 134 lb 8 oz (61 kg)   LMP 11/14/2017 (Exact Date)   BMI 26.27 kg/m  CONSTITUTIONAL: Well-developed, well-nourished female in no acute distress.  HENT:  Normocephalic, atraumatic. External right and left ear normal. Oropharynx is clear and moist EYES: right  Conjunctivae and EOM normal. Left eye with drooping lid, appears to be non-functioning NECK: Normal range of motion, supple, no masses SKIN: Skin is warm and dry. No rash noted. Not diaphoretic. No erythema. No pallor. NEUROLOGIC: Alert and oriented to person, place, and time. Normal reflexes, muscle tone coordination. No cranial nerve deficit noted. PSYCHIATRIC: Normal mood and affect. Normal behavior. Normal judgment and thought content. CARDIOVASCULAR: Normal heart rate noted RESPIRATORY: Effort normal, no problems with respiration noted ABDOMEN: Soft, no distention noted.   PELVIC: Deferred MUSCULOSKELETAL: Normal range of motion. No edema noted.  Labs and Imaging   Assessment & Plan:   1. TOA (tubo-ovarian abscess) Doing very well on augmentin for TOA Pain completely resolved To complete 14 days antibiotics total Return for f/u 2 weeks  Will give referral to pap clinic and mammogram scholarship next visit  Routine preventative health maintenance measures emphasized. Please refer to After Visit Summary for other counseling recommendations.   Return in about 2 weeks (around 12/04/2017).  Baldemar LenisK. Meryl Eugine Bubb, M.D. Attending Obstetrician & Gynecologist, [facility], [facility]

## 2017-11-20 NOTE — Progress Notes (Signed)
Recently in hospital for Infections & Kidney stones.

## 2017-11-30 ENCOUNTER — Encounter: Payer: Self-pay | Admitting: Obstetrics and Gynecology

## 2017-11-30 ENCOUNTER — Ambulatory Visit (INDEPENDENT_AMBULATORY_CARE_PROVIDER_SITE_OTHER): Payer: Self-pay | Admitting: Obstetrics and Gynecology

## 2017-11-30 VITALS — BP 115/60 | HR 74 | Ht 58.47 in | Wt 139.0 lb

## 2017-11-30 DIAGNOSIS — M545 Low back pain, unspecified: Secondary | ICD-10-CM

## 2017-11-30 DIAGNOSIS — G8929 Other chronic pain: Secondary | ICD-10-CM

## 2017-11-30 LAB — POCT URINALYSIS DIP (DEVICE)
BILIRUBIN URINE: NEGATIVE
GLUCOSE, UA: NEGATIVE mg/dL
Hgb urine dipstick: NEGATIVE
Ketones, ur: NEGATIVE mg/dL
Leukocytes, UA: NEGATIVE
Nitrite: NEGATIVE
PH: 5.5 (ref 5.0–8.0)
Protein, ur: NEGATIVE mg/dL
Urobilinogen, UA: 0.2 mg/dL (ref 0.0–1.0)

## 2017-11-30 LAB — POCT PREGNANCY, URINE: PREG TEST UR: NEGATIVE

## 2017-11-30 NOTE — Progress Notes (Signed)
Woke up up with pain on lower left back. Has not tried any otc med for this. Spanish interpreter "Grisel" [phone] used for visit.

## 2017-11-30 NOTE — Progress Notes (Signed)
[facility] 11/30/2017  CC: f/u hospitalization for possible right sided TOA.   Subjective:   Patient seen on 1/4 s/p 12/30 hospital discharge. On 1/4, she was doing well and w/o pain or discomfort and was set up to come back in two weeks.   She states that starting this morning she has some low left back sided cramping pain. It's non radiating and hurts worse with movement. She states she had something similar but in the remote past. She denies any VB, discharge, itching, fevers, chills, chest pain, sob, dysuria, hematuria or malodorous urine. She also states that this pain is different than the right sided low belly and back pain she had that brought her into the hospital.   LMP 12/27  Objective:    BP 115/60   Pulse 74   Ht 4' 10.47" (1.485 m)   Wt 139 lb (63 kg)   LMP 11/14/2017 (Exact Date)   BMI 28.59 kg/m   General:  alert  Abdomen: soft, bowel sounds active, non-tender, no abnormal masses    no CVAT  Labs: UPT and U/a negative  Assessment:  Patient stable  Plan:   D/w pt that sounds more musculoskeletal and I don't feel any imaging or additional lab work is needed; she works in housekeeping. I told her to do warm compresses and OTC analgesics and that if the pain get a lot worse or if it persists for a month or two to call us and can consider an u/s; will cancel one already scheduled. Pt amenable to plan  RTC prn  Interpreter used  Talmage Bingharlie Helana Macbride, Montez HagemanJr MD Attending Center [facility] (Faculty Practice) 11/30/2017

## 2017-12-03 ENCOUNTER — Ambulatory Visit (HOSPITAL_COMMUNITY): Payer: Self-pay

## 2018-07-02 IMAGING — US US PELVIS COMPLETE
1 series · 13 of 25 positions shown · non-contrast
Comparison: None.

CLINICAL DATA: Pelvic pain for 2 days with fever. Patient has a
history of septated uterus and septated vagina.

EXAM:
TRANSABDOMINAL AND TRANSVAGINAL ULTRASOUND OF PELVIS
DOPPLER ULTRASOUND OF OVARIES
TECHNIQUE: Both transabdominal and transvaginal ultrasound examinations of the
pelvis were performed. Transabdominal technique was performed for
global imaging of the pelvis including uterus, ovaries, adnexal
regions, and pelvic cul-de-sac.
It was necessary to proceed with endovaginal exam following the
transabdominal exam to visualize the bilateral ovaries and
endometrium.. Color and duplex Doppler ultrasound was utilized to
evaluate blood flow to the ovaries.

[Series 1: us pelvis complete · 0.24mm/px · 13 of 90 slices shown]
[im 1/90]
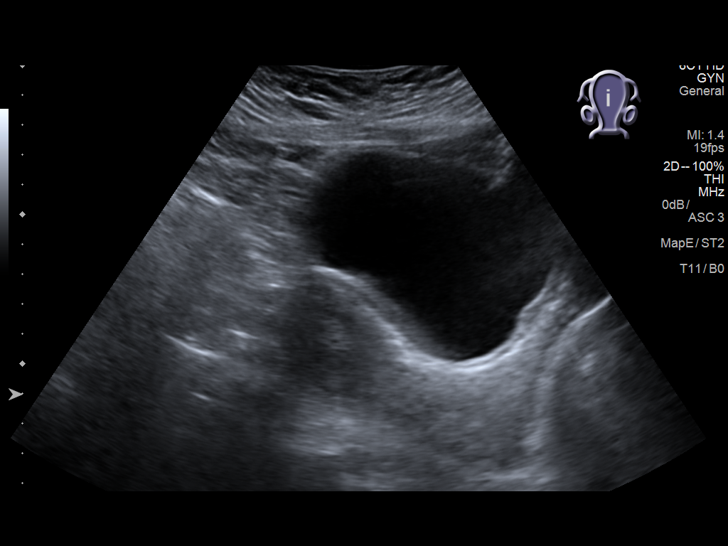
[im 8/90]
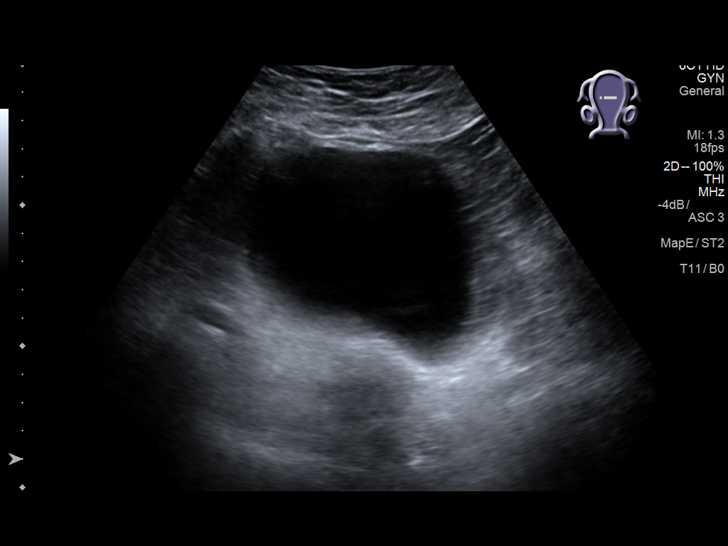
[im 15/90]
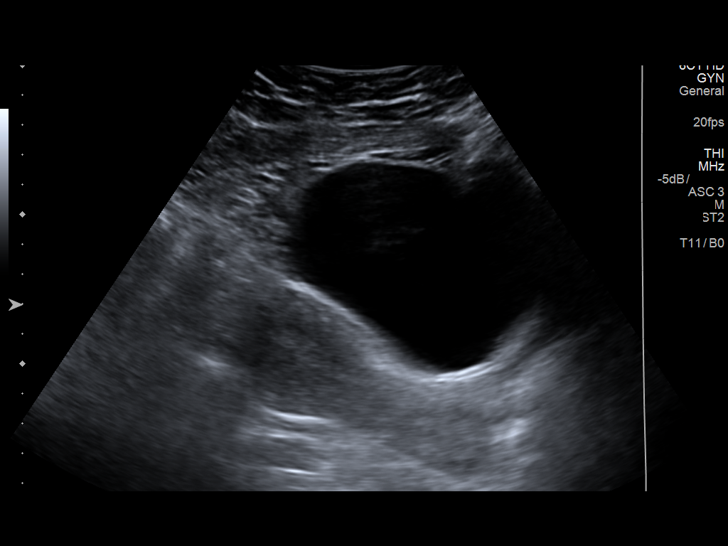
[im 23/90]
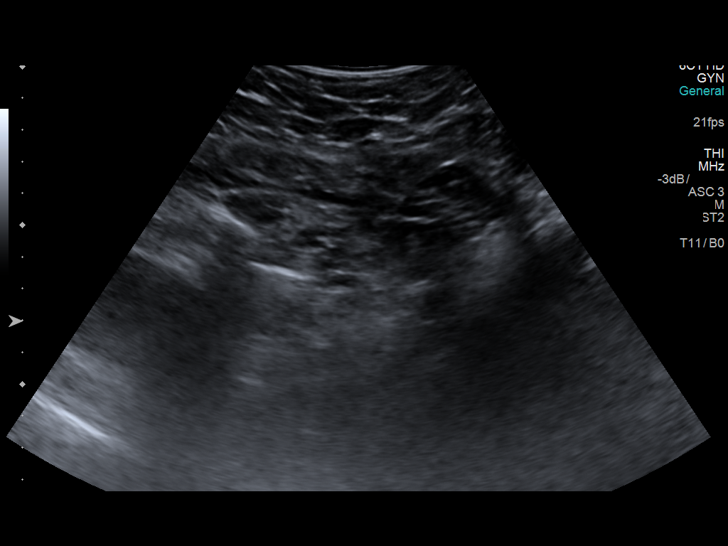
[im 30/90]
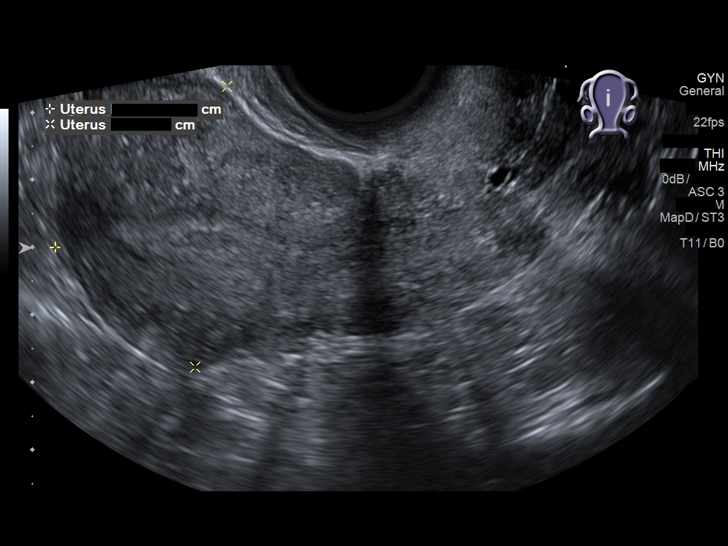
[im 38/90]
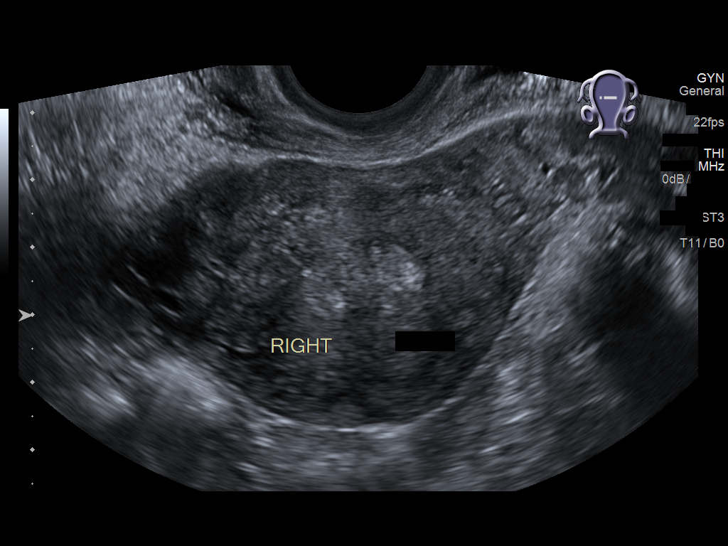
[im 45/90]
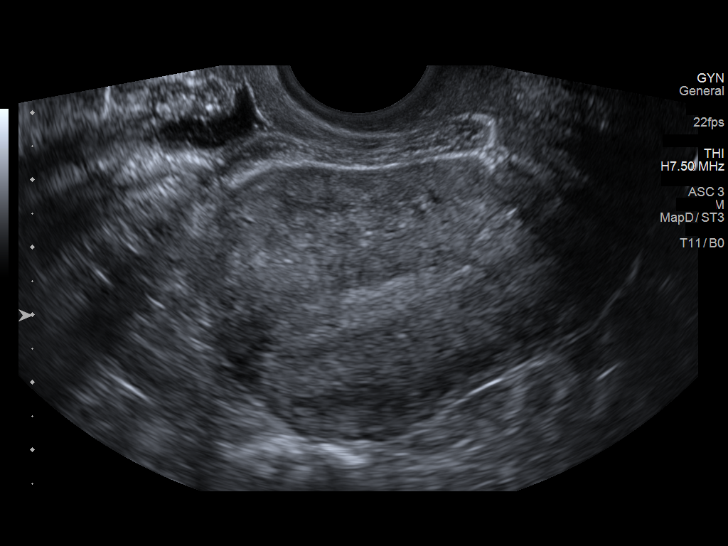
[im 52/90]
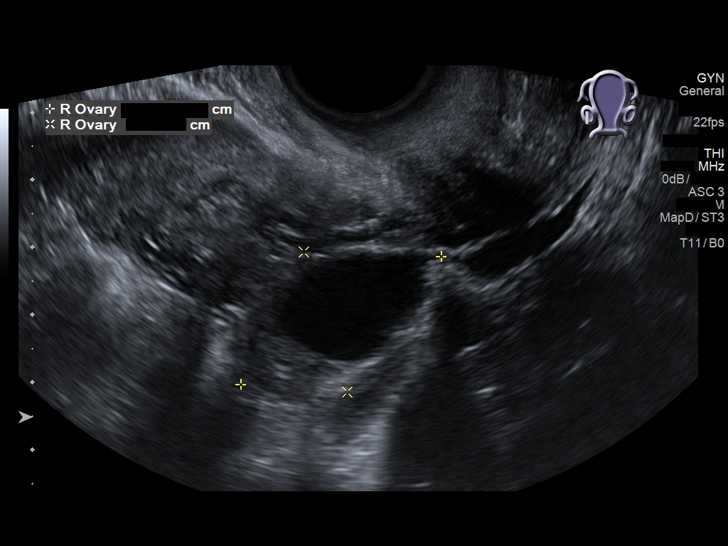
[im 60/90]
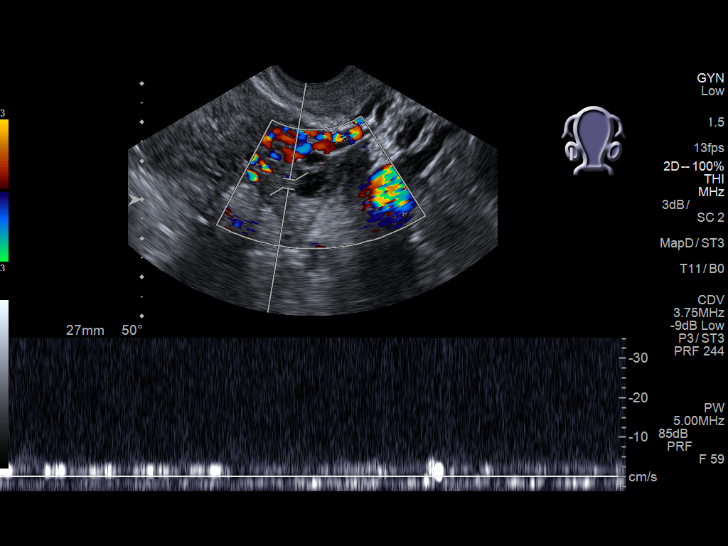
[im 67/90]
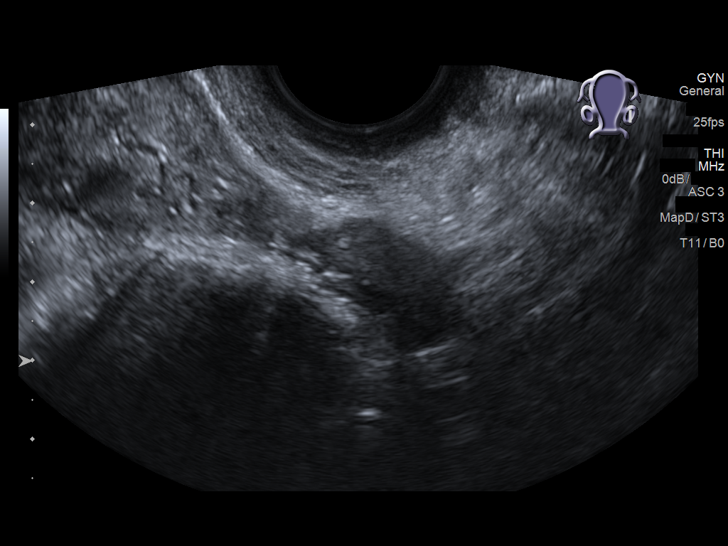
[im 75/90]
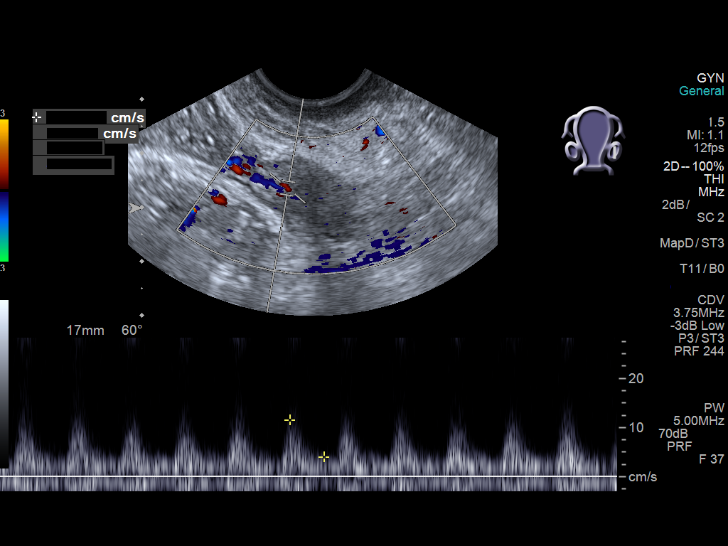
[im 82/90]
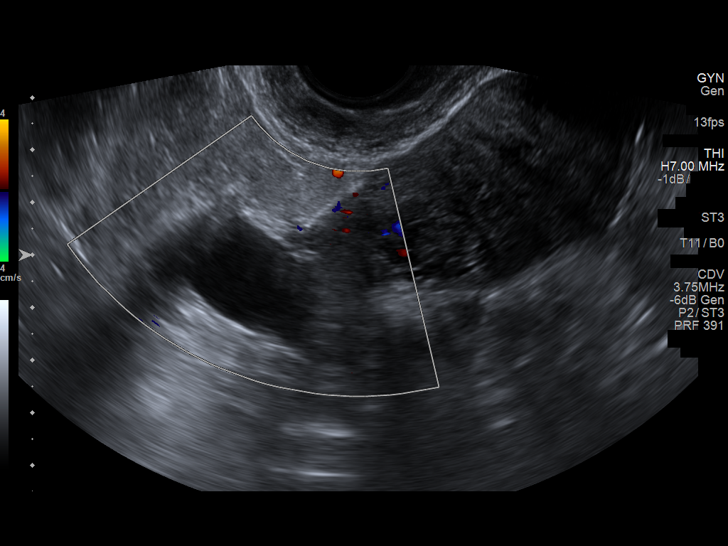
[im 90/90]
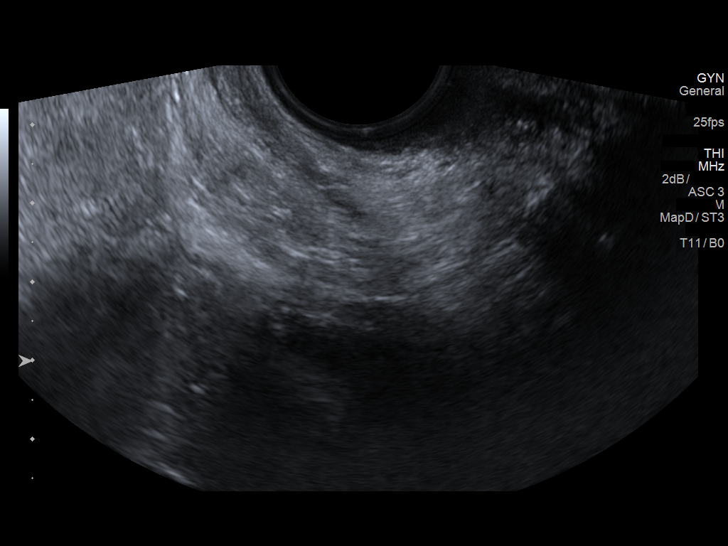

[13 of 25 positions shown; findings below may reference images not displayed]

FINDINGS: Uterus

Measurements: 9.6 x 3.9 x 4.3 cm. Two uterine horns are noted. No
fibroids or other mass visualized.

Endometrium

Thickness: 0.7 cm in right uterine horn, 0.8 cm in left uterine
horn.. No focal abnormality visualized.

Right ovary

Measurements: 3.5 x 2.2 x 3.1 cm. There is a 2.2 x 1.5 x 2.1 cm cyst
in the right ovary.

Left ovary

Measurements: 2.3 x 1.1 x 1.5 cm. Normal appearance/no adnexal mass.

Pulsed Doppler evaluation of both ovaries demonstrates normal
low-resistance arterial and venous waveforms.

Other findings

Trace fluid is identified in the cul-de-sac.

There is a 4.1 x 2.1 x 3.6 cm tubular structure in the right adnexa
correlating to the CT finding which may represent hydrosalpinx.
IMPRESSION: 4.1 cm tubular structure in the right adnexa correlating to the CT
finding, may represent hydrosalpinx.

No evidence of ovarian torsion.

Two uterine horns are identified without gross abnormality.

## 2019-04-25 IMAGING — CT CT RENAL STONE PROTOCOL
2 of 4 series · 15 of 46 positions shown, 17 images · non-contrast
Comparison: None.

CLINICAL DATA: Low back pain and nausea

EXAM:
CT ABDOMEN AND PELVIS WITHOUT CONTRAST
TECHNIQUE: Multidetector CT imaging of the abdomen and pelvis was performed
following the standard protocol without IV contrast.

[Series 3: ap without · axial · non-contrast · 0.68mm/px · z∈[+668,+1048]mm · 12 of 86 slices shown, 14 images]
[im 5/86  soft-tissue]
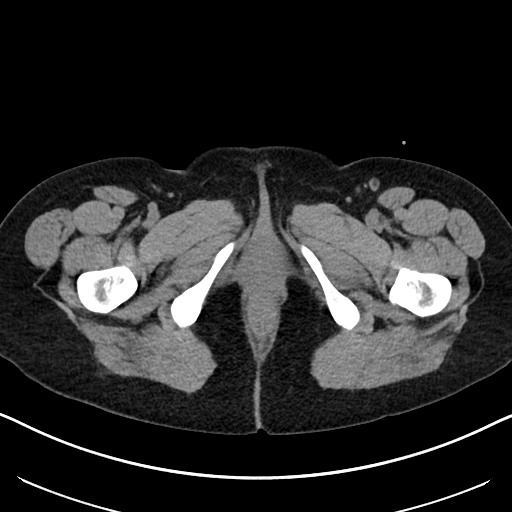
[im 5/86  bone]
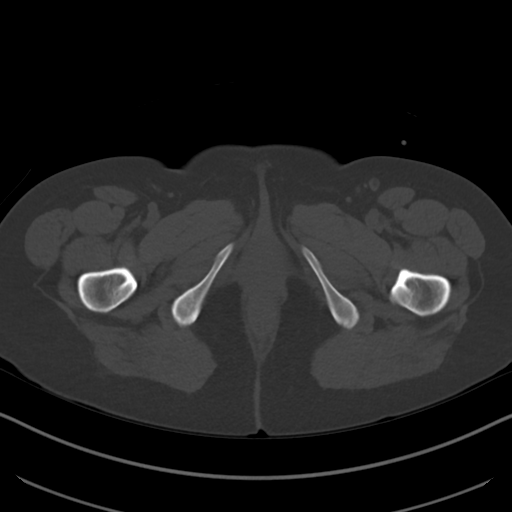
[im 15/86  soft-tissue]
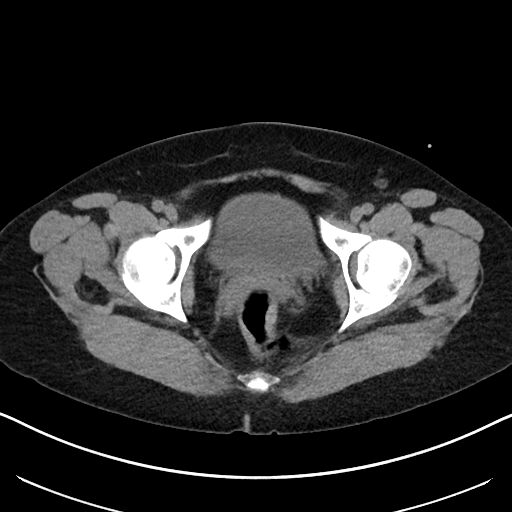
[im 19/86  soft-tissue]
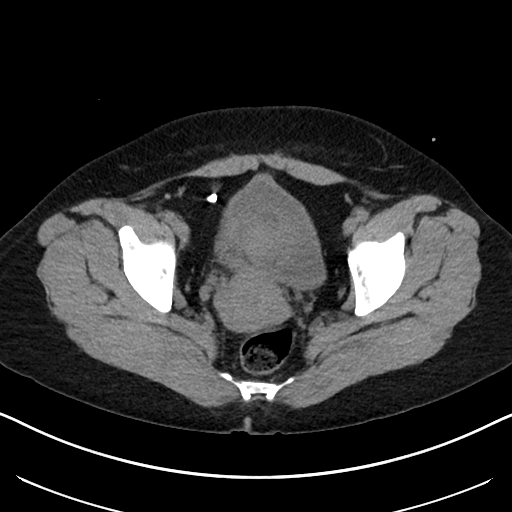
[im 24/86  soft-tissue]
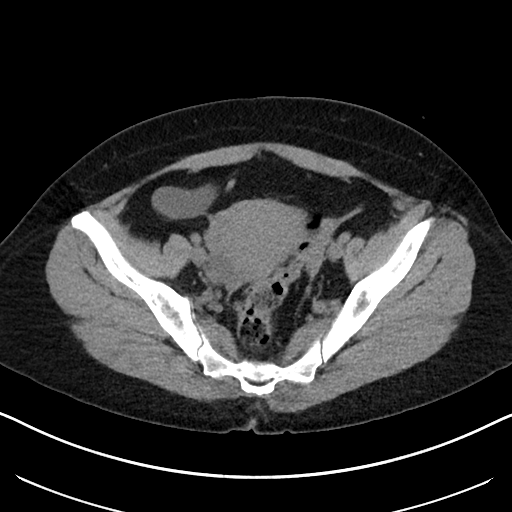
[im 34/86  soft-tissue]
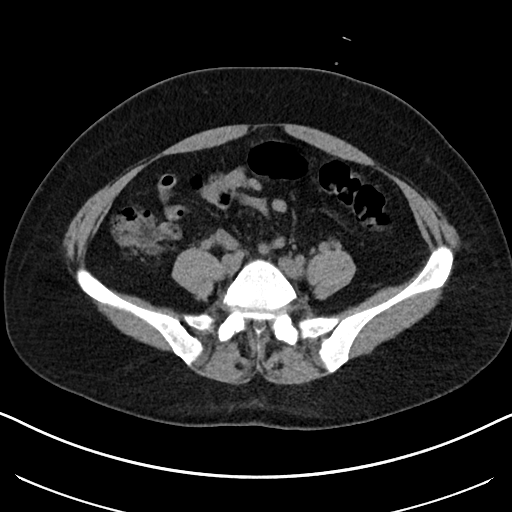
[im 38/86  soft-tissue]
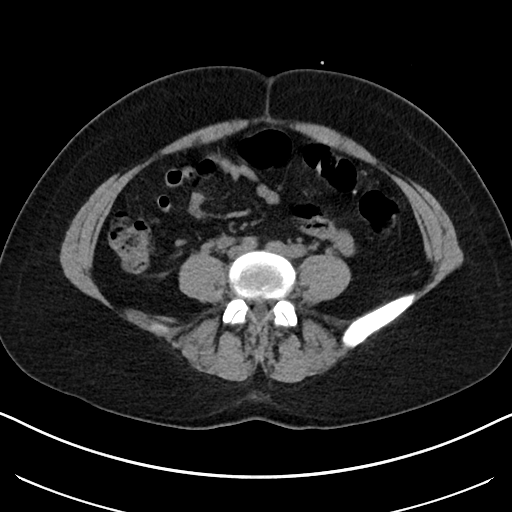
[im 48/86  soft-tissue]
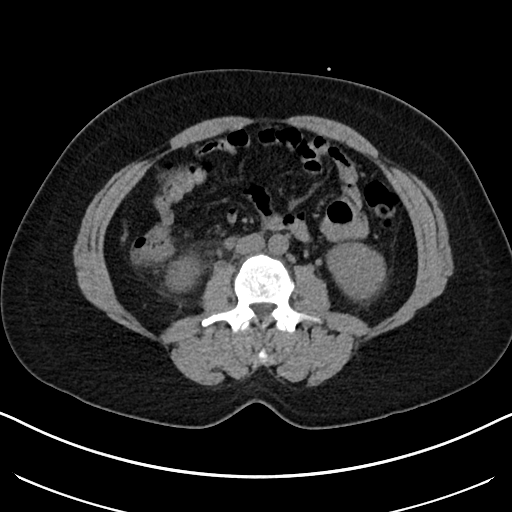
[im 52/86  soft-tissue]
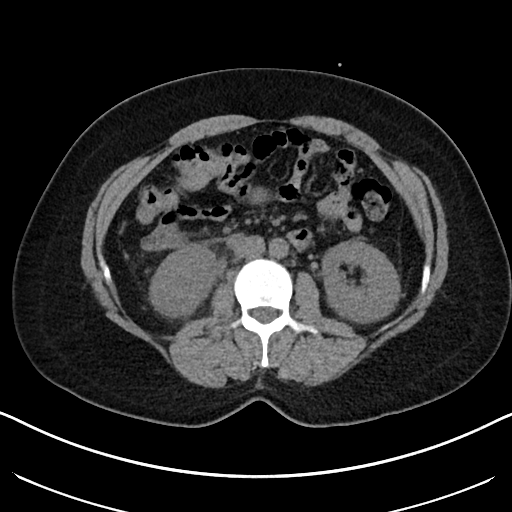
[im 62/86  soft-tissue]
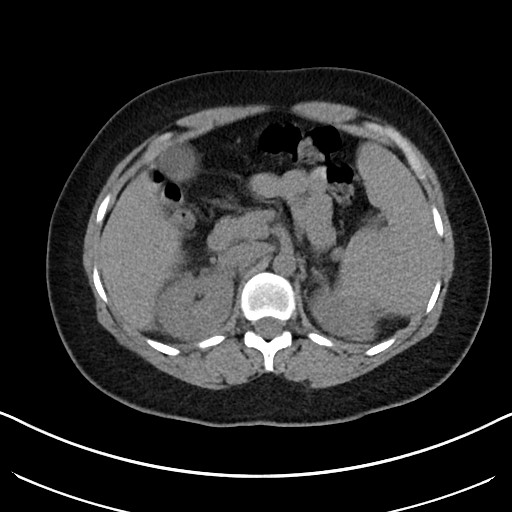
[im 62/86  bone]
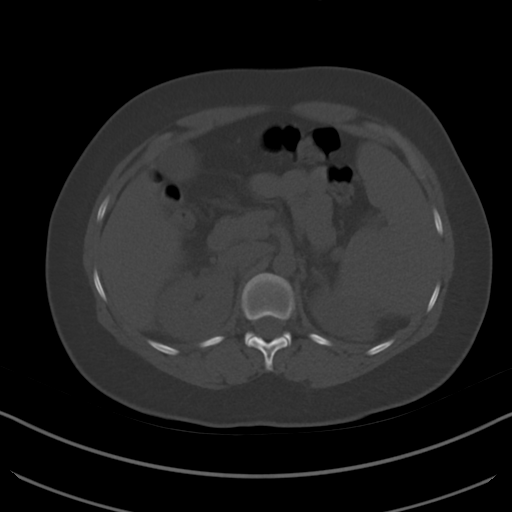
[im 67/86  soft-tissue]
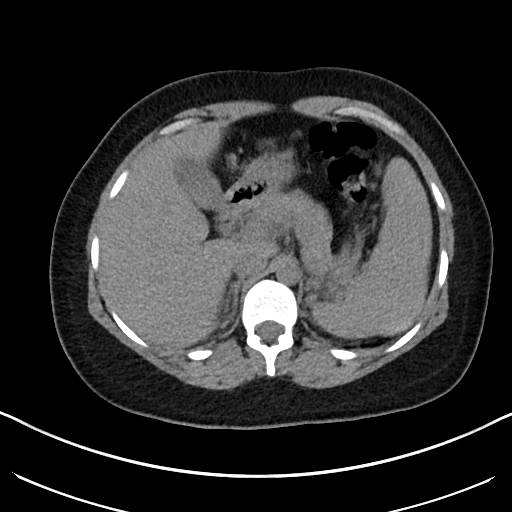
[im 71/86  soft-tissue]
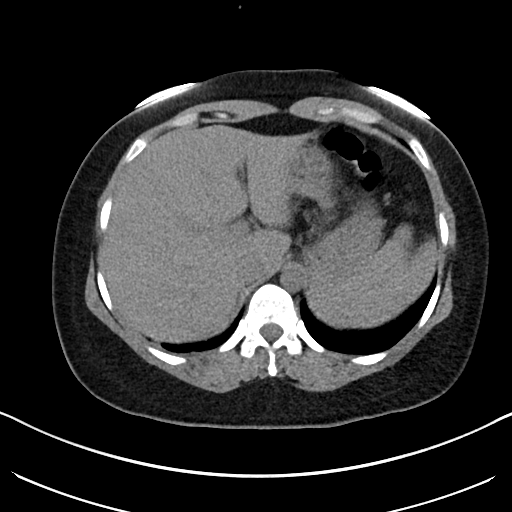
[im 81/86  soft-tissue]
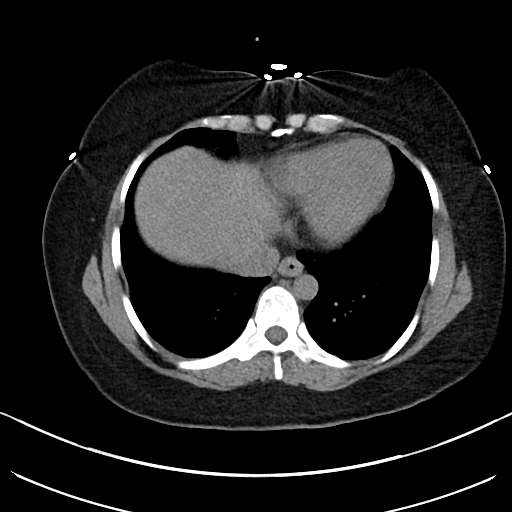

[Series 6: cor · coronal · 0.58mm/px · 3 of 70 slices shown]
[im 24/70  soft-tissue]
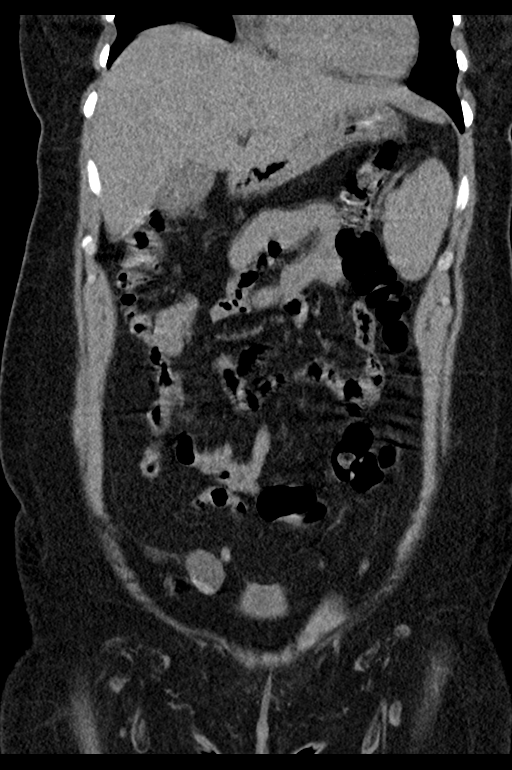
[im 31/70  soft-tissue]
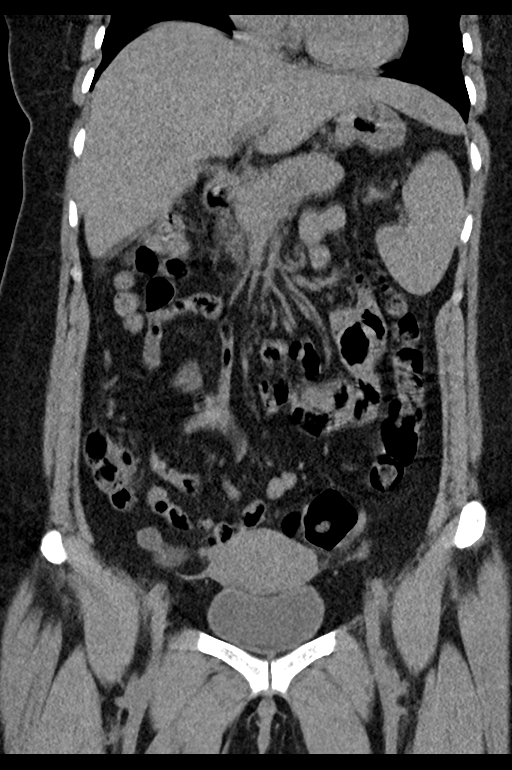
[im 39/70  soft-tissue]
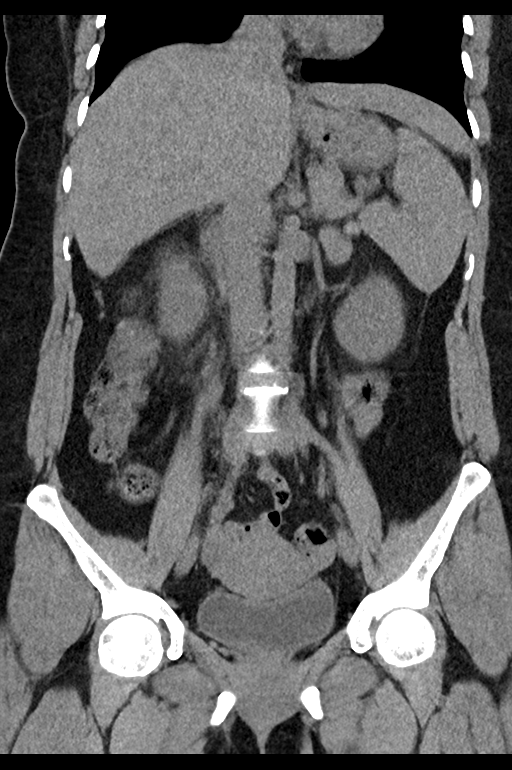

[15 of 46 positions shown; findings below may reference images not displayed]

FINDINGS: Lower chest: Subsegmental atelectasis.

Hepatobiliary: Unremarkable

Pancreas: Unremarkable

Spleen: Upper normal in size with the greatest 80 measurement of
13.1 cm.

Adrenals/Urinary Tract: 3 mm calculus in the lower pole of the right
kidney. Tiny calculus in the lower pole of the left kidney. No
hydronephrosis. There is asymmetrical right-sided perinephric
stranding as well as stranding along the course of the right ureter.
No definite ureteral calculus is identified. Bladder is
unremarkable.

Stomach/Bowel: Appendix is nonvisualized. It may have been resected
based on the report from the prior study dated 05/30/1999. No
evidence of small-bowel obstruction. Stomach is decompressed.
Duodenal C-loop is unremarkable.

Vascular/Lymphatic: No evidence of aortic aneurysm.

Reproductive: Uterus and left adnexa are within normal limits. There
is a 2.8 x 2.1 x 4.6 cm oblong fluid collection in the right adnexa
representing either an oblong ovarian cyst or possibly hydrosalpinx.

Other: No free-fluid in the pelvis.

Musculoskeletal: No vertebral compression deformity. L5-S1
degenerative disc disease.
IMPRESSION: Bilateral nephrolithiasis.

No definite ureteral calculus is visualized.

There is stranding surrounding the right kidney and right ureter.
Differential diagnosis includes an inflammatory process versus a
recently passed ureteral calculus. Correlation with urinalysis is
recommended.

Oblong right ovarian cyst versus hydrosalpinx.

## 2019-06-22 ENCOUNTER — Other Ambulatory Visit: Payer: Self-pay

## 2019-06-22 ENCOUNTER — Encounter (HOSPITAL_COMMUNITY): Payer: Self-pay | Admitting: Urgent Care

## 2019-06-22 ENCOUNTER — Ambulatory Visit (HOSPITAL_COMMUNITY)
Admission: EM | Admit: 2019-06-22 | Discharge: 2019-06-22 | Disposition: A | Discharge status: Home / Self Care | Payer: HRSA Program | Attending: Urgent Care | Admitting: Urgent Care

## 2019-06-22 ENCOUNTER — Telehealth: Payer: Self-pay | Admitting: Internal Medicine

## 2019-06-22 DIAGNOSIS — R52 Pain, unspecified: Secondary | ICD-10-CM | POA: Diagnosis present

## 2019-06-22 DIAGNOSIS — U071 COVID-19: Secondary | ICD-10-CM | POA: Diagnosis not present

## 2019-06-22 DIAGNOSIS — Z833 Family history of diabetes mellitus: Secondary | ICD-10-CM | POA: Insufficient documentation

## 2019-06-22 DIAGNOSIS — Z8249 Family history of ischemic heart disease and other diseases of the circulatory system: Secondary | ICD-10-CM | POA: Diagnosis not present

## 2019-06-22 NOTE — Discharge Instructions (Addendum)
We have tested you for COVID Labs pending and we will call you with any positive results.  You can take OTC meds for your symptoms.

## 2019-06-22 NOTE — Telephone Encounter (Signed)
Patient called requesting to be seen due to really bad headcahe, sore throat and body aches; Antony Madura asked patient is had been tested for [place]; patient denied. Patient was provided with information for free testing sites and to self insolate starting now along with all COVID19 recommendations.

## 2019-06-22 NOTE — ED Provider Notes (Signed)
[facility]    CSN: [id number] Arrival date & time: 06/22/19  1423     History   Chief Complaint Chief Complaint  Patient presents with  . Covid testing    HPI Stacy Barr is a 43 y.o. female.   Patient is a 43 year old female who presents today with approximately 2 days of generalized body aches, headache, sore throat.  Symptoms have been somewhat improved.  She is been taking Tylenol for symptoms.  Here for concerns for COVID and testing.  Denies any recent sick contacts or recent traveling.  Denies any known exposures.  No cough, chest congestion, ear pain, chest pain or shortness of breath.  ROS per HPI      Past Medical History:  Diagnosis Date  . Anxiety   . Depression    x 10 years, never on medication  . Septate vagina     Patient Active Problem List   Diagnosis Date Noted  . Abnormal uterine bleeding (AUB) 11/15/2017  . Nephrolithiasis 11/15/2017  . UTI (urinary tract infection) 11/15/2017  . TOA (tubo-ovarian abscess) 11/14/2017  . Acute hepatitis 12/20/2012  . Dysmenorrhea 11/17/2012  . Depression 11/16/2012  . Tinea corporis 11/16/2012    Past Surgical History:  Procedure Laterality Date  . APPENDECTOMY    . CESAREAN SECTION    . ECTOPIC PREGNANCY SURGERY     left  . TUBAL LIGATION  2011   left salpingectomy and right ligation s/p left ectopic    OB History    Gravida  4   Para  2   Term  2   Preterm      AB  2   Living  2     SAB      TAB      Ectopic      Multiple      Live Births           Obstetric Comments  c-section x 2         Home Medications    Prior to Admission medications   Medication Sig Start Date End Date Taking? Authorizing Provider  Multiple Vitamin (MULTIVITAMIN WITH MINERALS) TABS tablet Take 1 tablet by mouth daily.    [provider]    Family History Family History  Problem Relation Age of Onset  . Diabetes Mother   . Hypertension Mother   . Depression  Sister   . Mental illness Sister     Social History Social History   Tobacco Use  . Smoking status: Never Smoker  . Smokeless tobacco: Never Used  Substance Use Topics  . Alcohol use: Yes    Alcohol/week: 3.0 standard drinks    Types: 2 Glasses of wine, 1 Cans of beer per week  . Drug use: No     Allergies   Patient has no known allergies.   Review of Systems Review of Systems   Physical Exam Triage Vital Signs ED Triage Vitals  Enc Vitals Group     BP 06/22/19 1501 109/74     Pulse Rate 06/22/19 1501 89     Resp 06/22/19 1501 16     Temp 06/22/19 1501 98.5 F (36.9 C)     Temp Source 06/22/19 1501 Oral     SpO2 06/22/19 1501 100 %     Weight 06/22/19 1503 136 lb 3.2 oz (61.8 kg)     Height --      Head Circumference --      Peak Flow --  Pain Score 06/22/19 1537 0     Pain Loc --      Pain Edu? --      Excl. in [place]? --    No data found.  Updated Vital Signs BP 109/74 (BP Location: Right Arm)   Pulse 89   Temp 98.5 F (36.9 C) (Oral)   Resp 16   Wt 136 lb 3.2 oz (61.8 kg)   SpO2 100%   BMI 28.01 kg/m   Visual Acuity Right Eye Distance:   Left Eye Distance:   Bilateral Distance:    Right Eye Near:   Left Eye Near:    Bilateral Near:     Physical Exam Vitals signs and nursing note reviewed.  Constitutional:      General: She is not in acute distress.    Appearance: Normal appearance. She is not ill-appearing, toxic-appearing or diaphoretic.  HENT:     Head: Normocephalic.     Nose: Nose normal.     Mouth/Throat:     Pharynx: Oropharynx is clear.  Eyes:     Conjunctiva/sclera: Conjunctivae normal.  Neck:     Musculoskeletal: Normal range of motion.  Pulmonary:     Effort: Pulmonary effort is normal.  Abdominal:     Palpations: Abdomen is soft.     Tenderness: There is no abdominal tenderness.  Musculoskeletal: Normal range of motion.  Skin:    General: Skin is warm and dry.     Findings: No rash.  Neurological:     Mental  Status: She is alert.  Psychiatric:        Mood and Affect: Mood normal.      UC Treatments / Results  Labs (all labs ordered are listed, but only abnormal results are displayed) Labs Reviewed  NOVEL CORONAVIRUS, NAA (HOSPITAL ORDER, SEND-OUT TO REF LAB)    EKG   Radiology No results found.  Procedures Procedures (including critical care time)  Medications Ordered in UC Medications - No data to display  Initial Impression / Assessment and Plan / UC Course  I have reviewed the triage vital signs and the nursing notes.  Pertinent labs & imaging results that were available during my care of the patient were reviewed by me and considered in my medical decision making (see chart for details).     COVID testing done  Not highly suspicious for COVID. Precautions given Over-the-counter medications as needed for symptoms Follow up as needed for continued or worsening symptoms  Final Clinical Impressions(s) / UC Diagnoses   Final diagnoses:  Generalized body aches     Discharge Instructions     We have tested you for COVID Labs pending and we will call you with any positive results.  You can take OTC meds for your symptoms.     ED Prescriptions    None     Controlled Substance Prescriptions [place] Controlled Substance Registry consulted? Not Applicable   Janace ArisBast, Merle Cirelli A, NP 06/22/19 1544

## 2019-06-22 NOTE — ED Triage Notes (Signed)
Pt states she would like to be tested. Pt state she has body aches.

## 2019-06-23 ENCOUNTER — Telehealth (HOSPITAL_COMMUNITY): Payer: Self-pay | Admitting: Emergency Medicine

## 2019-06-23 LAB — NOVEL CORONAVIRUS, NAA (HOSP ORDER, SEND-OUT TO REF LAB; TAT 18-24 HRS): SARS-CoV-2, NAA: DETECTED — AB

## 2019-06-23 NOTE — Telephone Encounter (Signed)
Your test for COVID-19 was positive, meaning that you were infected with the novel coronavirus and could give the germ to others.  Please continue isolation at home, for at least 10 days since the start of your fever/cough/breathlessness and until you have had 3 consecutive days without fever (without taking a fever reducer) and with cough/breathlessness improving. Please continue good preventive care measures, including:  frequent hand-washing, avoid touching your face, cover coughs/sneezes, stay out of crowds and keep a 6 foot distance from others.  Recheck or go to the nearest hospital ED tent for re-assessment if fever/cough/breathlessness return.  Patient contacted and made aware of    results, all questions answered  Pt requesting mailing lab results and work note.

## 2019-07-05 ENCOUNTER — Other Ambulatory Visit: Payer: Self-pay | Admitting: Internal Medicine

## 2019-07-05 DIAGNOSIS — Z20822 Contact with and (suspected) exposure to covid-19: Secondary | ICD-10-CM

## 2019-07-06 LAB — NOVEL CORONAVIRUS, NAA: SARS-CoV-2, NAA: NOT DETECTED

## 2019-12-09 ENCOUNTER — Other Ambulatory Visit: Payer: Self-pay

## 2019-12-09 ENCOUNTER — Ambulatory Visit (HOSPITAL_COMMUNITY)
Admission: EM | Admit: 2019-12-09 | Discharge: 2019-12-09 | Disposition: A | Discharge status: Home / Self Care | Payer: Self-pay

## 2019-12-09 ENCOUNTER — Encounter (HOSPITAL_COMMUNITY): Payer: Self-pay

## 2019-12-09 ENCOUNTER — Ambulatory Visit (HOSPITAL_COMMUNITY)
Admission: EM | Admit: 2019-12-09 | Discharge: 2019-12-09 | Disposition: A | Discharge status: Home / Self Care | Payer: Self-pay | Attending: Family Medicine | Admitting: Family Medicine

## 2019-12-09 DIAGNOSIS — T161XXA Foreign body in right ear, initial encounter: Secondary | ICD-10-CM

## 2019-12-09 MED ORDER — LIDOCAINE-EPINEPHRINE-TETRACAINE (LET) TOPICAL GEL
TOPICAL | Status: AC
  Filled 2019-12-09: qty 3

## 2019-12-09 NOTE — ED Provider Notes (Signed)
[facility]    CSN: [id number] Arrival date & time: 12/09/19  8756      History   Chief Complaint Chief Complaint  Patient presents with  . Foreign Body in Ear    HPI Stacy Barr is a 44 y.o. female.   Patient is a 44 year old female that presents today with foreign body in the right ear.  She believes this to be a bug.  She can feel the insect moving around and flapping its wings .  This started this morning.  Mild pain in the ear when the incident moves. No fever, ear drainage.   ROS per HPI         Past Medical History:  Diagnosis Date  . Anxiety   . Depression    x 10 years, never on medication  . Septate vagina     Patient Active Problem List   Diagnosis Date Noted  . Abnormal uterine bleeding (AUB) 11/15/2017  . Nephrolithiasis 11/15/2017  . UTI (urinary tract infection) 11/15/2017  . TOA (tubo-ovarian abscess) 11/14/2017  . Acute hepatitis 12/20/2012  . Dysmenorrhea 11/17/2012  . Depression 11/16/2012  . Tinea corporis 11/16/2012    Past Surgical History:  Procedure Laterality Date  . APPENDECTOMY    . CESAREAN SECTION    . ECTOPIC PREGNANCY SURGERY     left  . TUBAL LIGATION  2011   left salpingectomy and right ligation s/p left ectopic    OB History    Gravida  4   Para  2   Term  2   Preterm      AB  2   Living  2     SAB      TAB      Ectopic      Multiple      Live Births           Obstetric Comments  c-section x 2         Home Medications    Prior to Admission medications   Medication Sig Start Date End Date Taking? Authorizing Provider  Multiple Vitamin (MULTIVITAMIN WITH MINERALS) TABS tablet Take 1 tablet by mouth daily.    [provider]    Family History Family History  Problem Relation Age of Onset  . Diabetes Mother   . Hypertension Mother   . Depression Sister   . Mental illness Sister     Social History Social History   Tobacco Use  . Smoking status:  Never Smoker  . Smokeless tobacco: Never Used  Substance Use Topics  . Alcohol use: Yes    Alcohol/week: 3.0 standard drinks    Types: 2 Glasses of wine, 1 Cans of beer per week  . Drug use: No     Allergies   Patient has no known allergies.   Review of Systems Review of Systems   Physical Exam Triage Vital Signs ED Triage Vitals  Enc Vitals Group     BP 12/09/19 0914 109/72     Pulse Rate 12/09/19 0914 70     Resp 12/09/19 0914 (!) 21     Temp 12/09/19 0914 98.1 F (36.7 C)     Temp Source 12/09/19 0914 Oral     SpO2 12/09/19 0914 100 %     Weight --      Height --      Head Circumference --      Peak Flow --      Pain Score 12/09/19 0912 6  Pain Loc --      Pain Edu? --      Excl. in [place]? --    No data found.  Updated Vital Signs BP 109/72 (BP Location: Left Arm)   Pulse 70   Temp 98.1 F (36.7 C) (Oral)   Resp (!) 21   LMP 11/14/2017 (Exact Date)   SpO2 100%   Visual Acuity Right Eye Distance:   Left Eye Distance:   Bilateral Distance:    Right Eye Near:   Left Eye Near:    Bilateral Near:     Physical Exam Vitals and nursing note reviewed.  Constitutional:      General: She is not in acute distress.    Appearance: Normal appearance. She is not ill-appearing, toxic-appearing or diaphoretic.  HENT:     Head: Normocephalic.     Comments: Insect viewed in right ear canal near TM    Nose: Nose normal.  Eyes:     Conjunctiva/sclera: Conjunctivae normal.  Pulmonary:     Effort: Pulmonary effort is normal.  Musculoskeletal:        General: Normal range of motion.     Cervical back: Normal range of motion.  Skin:    General: Skin is warm and dry.     Findings: No rash.  Neurological:     Mental Status: She is alert.  Psychiatric:        Mood and Affect: Mood normal.      UC Treatments / Results  Labs (all labs ordered are listed, but only abnormal results are displayed) Labs Reviewed - No data to display  EKG   Radiology No  results found.  Procedures Foreign Body Removal  Date/Time: 12/09/2019 10:29 AM Performed by: Orvan July, NP Authorized by: Orvan July, NP   Consent:    Consent obtained:  Verbal   Risks discussed:  Bleeding and pain Location:    Location:  Ear   Ear location:  R ear Anesthesia (see MAR for exact dosages):    Anesthesia method: LET. Procedure details:    Removal mechanism: saline irriagtion and currett.   Foreign bodies recovered:  1   Description:  Insect    Intact foreign body removal: yes   Post-procedure details:    Confirmation:  No additional foreign bodies on visualization   Patient tolerance of procedure:  Tolerated with difficulty   (including critical care time)  Medications Ordered in UC Medications - No data to display  Initial Impression / Assessment and Plan / UC Course  I have reviewed the triage vital signs and the nursing notes.  Pertinent labs & imaging results that were available during my care of the patient were reviewed by me and considered in my medical decision making (see chart for details).     Foreign body in the right ear.  Insect removed from right ear with flushing and curette  Final Clinical Impressions(s) / UC Diagnoses   Final diagnoses:  Foreign body of right ear, initial encounter     Discharge Instructions     We removed the bug from your ear.  Follow up as needed for continued or worsening symptoms     ED Prescriptions    None     PDMP not reviewed this encounter.   Loura Halt A, NP 12/09/19 1030

## 2019-12-09 NOTE — ED Triage Notes (Signed)
Pt report she is having and insect in her left ear since this morning. Pt states she is in pain every time the insect in moving inside her right ear.

## 2019-12-09 NOTE — ED Notes (Signed)
Patient LWBS due to refusal to pay.

## 2019-12-09 NOTE — Discharge Instructions (Addendum)
We removed the bug from your ear.  Follow up as needed for continued or worsening symptoms
